# Patient Record
Sex: Female | Born: 1965
Health system: Southern US, Community
[De-identification: ages and names within clinical notes are randomized; demographics above are authoritative.]

## PROBLEM LIST (undated history)

## (undated) DIAGNOSIS — R161 Splenomegaly, not elsewhere classified: Secondary | ICD-10-CM

## (undated) DIAGNOSIS — Z8619 Personal history of other infectious and parasitic diseases: Secondary | ICD-10-CM

## (undated) DIAGNOSIS — Z789 Other specified health status: Secondary | ICD-10-CM

## (undated) DIAGNOSIS — C8280 Other types of follicular lymphoma, unspecified site: Secondary | ICD-10-CM

## (undated) HISTORY — PX: APPENDECTOMY: SHX54

## (undated) HISTORY — DX: Other types of follicular lymphoma, unspecified site: C82.80

## (undated) HISTORY — DX: Personal history of other infectious and parasitic diseases: Z86.19

---

## 1997-09-28 ENCOUNTER — Inpatient Hospital Stay (HOSPITAL_COMMUNITY): Admission: AD | Admit: 1997-09-28 | Discharge: 1997-09-28 | Payer: Self-pay | Admitting: Obstetrics and Gynecology

## 1997-10-06 ENCOUNTER — Inpatient Hospital Stay (HOSPITAL_COMMUNITY): Admission: AD | Admit: 1997-10-06 | Discharge: 1997-10-06 | Payer: Self-pay | Admitting: *Deleted

## 1997-10-07 ENCOUNTER — Inpatient Hospital Stay (HOSPITAL_COMMUNITY): Admission: AD | Admit: 1997-10-07 | Discharge: 1997-10-09 | Payer: Self-pay | Admitting: Obstetrics and Gynecology

## 1999-08-05 ENCOUNTER — Other Ambulatory Visit: Admission: RE | Admit: 1999-08-05 | Discharge: 1999-08-05 | Payer: Self-pay | Admitting: *Deleted

## 2001-03-10 ENCOUNTER — Other Ambulatory Visit: Admission: RE | Admit: 2001-03-10 | Discharge: 2001-03-10 | Payer: Self-pay | Admitting: *Deleted

## 2002-05-16 ENCOUNTER — Other Ambulatory Visit: Admission: RE | Admit: 2002-05-16 | Discharge: 2002-05-16 | Payer: Self-pay | Admitting: Obstetrics and Gynecology

## 2004-03-17 ENCOUNTER — Other Ambulatory Visit: Admission: RE | Admit: 2004-03-17 | Discharge: 2004-03-17 | Payer: Self-pay | Admitting: Obstetrics and Gynecology

## 2005-09-23 ENCOUNTER — Encounter: Admission: RE | Admit: 2005-09-23 | Discharge: 2005-09-23 | Payer: Self-pay | Admitting: Obstetrics and Gynecology

## 2007-02-24 ENCOUNTER — Encounter: Admission: RE | Admit: 2007-02-24 | Discharge: 2007-02-24 | Payer: Self-pay | Admitting: Obstetrics and Gynecology

## 2008-01-26 HISTORY — PX: PORT A CATH REVISION: SHX6033

## 2008-09-23 ENCOUNTER — Ambulatory Visit: Payer: Self-pay | Admitting: Interventional Radiology

## 2008-09-23 ENCOUNTER — Emergency Department (HOSPITAL_BASED_OUTPATIENT_CLINIC_OR_DEPARTMENT_OTHER): Admission: EM | Admit: 2008-09-23 | Discharge: 2008-09-23 | Payer: Self-pay | Admitting: Emergency Medicine

## 2008-09-24 ENCOUNTER — Inpatient Hospital Stay (HOSPITAL_COMMUNITY): Admission: AD | Admit: 2008-09-24 | Discharge: 2008-09-27 | Payer: Self-pay

## 2008-09-24 ENCOUNTER — Ambulatory Visit: Payer: Self-pay | Admitting: Hematology & Oncology

## 2008-09-25 ENCOUNTER — Encounter (INDEPENDENT_AMBULATORY_CARE_PROVIDER_SITE_OTHER): Payer: Self-pay | Admitting: Interventional Radiology

## 2008-09-25 ENCOUNTER — Ambulatory Visit: Payer: Self-pay | Admitting: Hematology & Oncology

## 2008-10-09 ENCOUNTER — Ambulatory Visit (HOSPITAL_COMMUNITY): Admission: RE | Admit: 2008-10-09 | Discharge: 2008-10-09 | Payer: Self-pay | Admitting: Hematology & Oncology

## 2008-10-09 ENCOUNTER — Encounter: Payer: Self-pay | Admitting: Hematology & Oncology

## 2008-10-10 ENCOUNTER — Ambulatory Visit (HOSPITAL_COMMUNITY): Admission: RE | Admit: 2008-10-10 | Discharge: 2008-10-10 | Payer: Self-pay | Admitting: Hematology & Oncology

## 2008-10-11 ENCOUNTER — Ambulatory Visit (HOSPITAL_COMMUNITY): Admission: RE | Admit: 2008-10-11 | Discharge: 2008-10-11 | Payer: Self-pay | Admitting: General Surgery

## 2008-10-11 ENCOUNTER — Encounter (INDEPENDENT_AMBULATORY_CARE_PROVIDER_SITE_OTHER): Payer: Self-pay | Admitting: General Surgery

## 2008-10-18 ENCOUNTER — Ambulatory Visit (HOSPITAL_COMMUNITY): Admission: RE | Admit: 2008-10-18 | Discharge: 2008-10-18 | Payer: Self-pay | Admitting: General Surgery

## 2008-11-01 ENCOUNTER — Ambulatory Visit: Payer: Self-pay | Admitting: Hematology & Oncology

## 2008-11-04 LAB — CBC WITH DIFFERENTIAL (CANCER CENTER ONLY)
BASO#: 0 10*3/uL (ref 0.0–0.2)
Eosinophils Absolute: 0.1 10*3/uL (ref 0.0–0.5)
HGB: 11.2 g/dL — ABNORMAL LOW (ref 11.6–15.9)
MCH: 23.5 pg — ABNORMAL LOW (ref 26.0–34.0)
MONO%: 7.4 % (ref 0.0–13.0)
NEUT#: 3.1 10*3/uL (ref 1.5–6.5)
WBC: 4.4 10*3/uL (ref 3.9–10.0)

## 2008-11-04 LAB — COMPREHENSIVE METABOLIC PANEL
Alkaline Phosphatase: 95 U/L (ref 39–117)
CO2: 25 mEq/L (ref 19–32)
Creatinine, Ser: 0.64 mg/dL (ref 0.40–1.20)
Glucose, Bld: 95 mg/dL (ref 70–99)
Sodium: 138 mEq/L (ref 135–145)
Total Bilirubin: 0.9 mg/dL (ref 0.3–1.2)
Total Protein: 6 g/dL (ref 6.0–8.3)

## 2008-12-02 ENCOUNTER — Ambulatory Visit: Payer: Self-pay | Admitting: Hematology & Oncology

## 2008-12-02 LAB — CBC WITH DIFFERENTIAL (CANCER CENTER ONLY)
BASO%: 1.1 % (ref 0.0–2.0)
HCT: 39.6 % (ref 34.8–46.6)
LYMPH%: 16 % (ref 14.0–48.0)
MCH: 25.1 pg — ABNORMAL LOW (ref 26.0–34.0)
MCV: 72 fL — ABNORMAL LOW (ref 81–101)
MONO#: 0.5 10*3/uL (ref 0.1–0.9)
MONO%: 6 % (ref 0.0–13.0)
NEUT%: 73.4 % (ref 39.6–80.0)
Platelets: 155 10*3/uL (ref 145–400)
RDW: 17.8 % — ABNORMAL HIGH (ref 10.5–14.6)
WBC: 7.7 10*3/uL (ref 3.9–10.0)

## 2008-12-02 LAB — COMPREHENSIVE METABOLIC PANEL
AST: 32 U/L (ref 0–37)
Albumin: 4.4 g/dL (ref 3.5–5.2)
Alkaline Phosphatase: 74 U/L (ref 39–117)
BUN: 12 mg/dL (ref 6–23)
Potassium: 3.8 mEq/L (ref 3.5–5.3)
Sodium: 137 mEq/L (ref 135–145)
Total Protein: 6.8 g/dL (ref 6.0–8.3)

## 2008-12-23 ENCOUNTER — Ambulatory Visit (HOSPITAL_COMMUNITY): Admission: RE | Admit: 2008-12-23 | Discharge: 2008-12-23 | Payer: Self-pay | Admitting: Hematology & Oncology

## 2008-12-30 LAB — CBC WITH DIFFERENTIAL (CANCER CENTER ONLY)
BASO#: 0 10*3/uL (ref 0.0–0.2)
Eosinophils Absolute: 0.2 10*3/uL (ref 0.0–0.5)
HCT: 39.3 % (ref 34.8–46.6)
HGB: 13.7 g/dL (ref 11.6–15.9)
LYMPH%: 8.8 % — ABNORMAL LOW (ref 14.0–48.0)
MCH: 26.8 pg (ref 26.0–34.0)
MCV: 77 fL — ABNORMAL LOW (ref 81–101)
MONO#: 0.3 10*3/uL (ref 0.1–0.9)
MONO%: 5.8 % (ref 0.0–13.0)
NEUT%: 81.2 % — ABNORMAL HIGH (ref 39.6–80.0)
Platelets: 155 10*3/uL (ref 145–400)
RBC: 5.11 10*6/uL (ref 3.70–5.32)
WBC: 4.9 10*3/uL (ref 3.9–10.0)

## 2008-12-30 LAB — COMPREHENSIVE METABOLIC PANEL
Albumin: 4.4 g/dL (ref 3.5–5.2)
BUN: 14 mg/dL (ref 6–23)
CO2: 23 mEq/L (ref 19–32)
Calcium: 8.8 mg/dL (ref 8.4–10.5)
Chloride: 105 mEq/L (ref 96–112)
Glucose, Bld: 95 mg/dL (ref 70–99)
Potassium: 3.9 mEq/L (ref 3.5–5.3)

## 2008-12-30 LAB — LACTATE DEHYDROGENASE: LDH: 127 U/L (ref 94–250)

## 2009-01-23 ENCOUNTER — Ambulatory Visit: Payer: Self-pay | Admitting: Hematology & Oncology

## 2009-01-27 LAB — CBC WITH DIFFERENTIAL (CANCER CENTER ONLY)
BASO#: 0 10*3/uL (ref 0.0–0.2)
HCT: 39.1 % (ref 34.8–46.6)
HGB: 13.5 g/dL (ref 11.6–15.9)
LYMPH#: 0.4 10*3/uL — ABNORMAL LOW (ref 0.9–3.3)
MCHC: 34.4 g/dL (ref 32.0–36.0)
MCV: 83 fL (ref 81–101)
MONO#: 0.3 10*3/uL (ref 0.1–0.9)
NEUT%: 82.6 % — ABNORMAL HIGH (ref 39.6–80.0)
WBC: 4.6 10*3/uL (ref 3.9–10.0)

## 2009-01-27 LAB — COMPREHENSIVE METABOLIC PANEL
BUN: 14 mg/dL (ref 6–23)
CO2: 20 mEq/L (ref 19–32)
Calcium: 8.6 mg/dL (ref 8.4–10.5)
Chloride: 105 mEq/L (ref 96–112)
Creatinine, Ser: 0.57 mg/dL (ref 0.40–1.20)

## 2009-01-27 LAB — LACTATE DEHYDROGENASE: LDH: 142 U/L (ref 94–250)

## 2009-02-24 ENCOUNTER — Ambulatory Visit: Payer: Self-pay | Admitting: Hematology & Oncology

## 2009-02-24 LAB — CBC WITH DIFFERENTIAL (CANCER CENTER ONLY)
BASO#: 0 10*3/uL (ref 0.0–0.2)
EOS%: 3.8 % (ref 0.0–7.0)
Eosinophils Absolute: 0.1 10*3/uL (ref 0.0–0.5)
HGB: 13 g/dL (ref 11.6–15.9)
LYMPH#: 0.3 10*3/uL — ABNORMAL LOW (ref 0.9–3.3)
NEUT#: 2.8 10*3/uL (ref 1.5–6.5)
RBC: 4.2 10*6/uL (ref 3.70–5.32)
WBC: 3.4 10*3/uL — ABNORMAL LOW (ref 3.9–10.0)

## 2009-02-24 LAB — COMPREHENSIVE METABOLIC PANEL
AST: 30 U/L (ref 0–37)
Albumin: 4 g/dL (ref 3.5–5.2)
BUN: 12 mg/dL (ref 6–23)
Calcium: 8.8 mg/dL (ref 8.4–10.5)
Chloride: 108 mEq/L (ref 96–112)
Glucose, Bld: 85 mg/dL (ref 70–99)
Potassium: 4 mEq/L (ref 3.5–5.3)

## 2009-03-24 LAB — CBC WITH DIFFERENTIAL (CANCER CENTER ONLY)
BASO#: 0 10*3/uL (ref 0.0–0.2)
EOS%: 3.6 % (ref 0.0–7.0)
Eosinophils Absolute: 0.1 10*3/uL (ref 0.0–0.5)
HCT: 39.4 % (ref 34.8–46.6)
HGB: 13.5 g/dL (ref 11.6–15.9)
LYMPH%: 10 % — ABNORMAL LOW (ref 14.0–48.0)
MCH: 30.4 pg (ref 26.0–34.0)
MCHC: 34.4 g/dL (ref 32.0–36.0)
MCV: 89 fL (ref 81–101)
MONO%: 6.6 % (ref 0.0–13.0)
NEUT#: 2.8 10*3/uL (ref 1.5–6.5)
NEUT%: 79.3 % (ref 39.6–80.0)
RBC: 4.45 10*6/uL (ref 3.70–5.32)

## 2009-03-24 LAB — COMPREHENSIVE METABOLIC PANEL
AST: 28 U/L (ref 0–37)
Albumin: 4.5 g/dL (ref 3.5–5.2)
Alkaline Phosphatase: 57 U/L (ref 39–117)
BUN: 11 mg/dL (ref 6–23)
Creatinine, Ser: 0.62 mg/dL (ref 0.40–1.20)
Glucose, Bld: 91 mg/dL (ref 70–99)
Potassium: 3.9 mEq/L (ref 3.5–5.3)

## 2009-04-28 ENCOUNTER — Ambulatory Visit (HOSPITAL_COMMUNITY): Admission: RE | Admit: 2009-04-28 | Discharge: 2009-04-28 | Payer: Self-pay | Admitting: Hematology & Oncology

## 2009-05-01 ENCOUNTER — Ambulatory Visit: Payer: Self-pay | Admitting: Hematology & Oncology

## 2009-05-05 LAB — CBC WITH DIFFERENTIAL (CANCER CENTER ONLY)
BASO%: 0.2 % (ref 0.0–2.0)
EOS%: 3.4 % (ref 0.0–7.0)
LYMPH#: 0.2 10*3/uL — ABNORMAL LOW (ref 0.9–3.3)
LYMPH%: 5.9 % — ABNORMAL LOW (ref 14.0–48.0)
MCHC: 34.6 g/dL (ref 32.0–36.0)
MCV: 89 fL (ref 81–101)
MONO#: 0.2 10*3/uL (ref 0.1–0.9)
Platelets: 119 10*3/uL — ABNORMAL LOW (ref 145–400)
RDW: 13.1 % (ref 10.5–14.6)
WBC: 3.8 10*3/uL — ABNORMAL LOW (ref 3.9–10.0)

## 2009-05-05 LAB — COMPREHENSIVE METABOLIC PANEL
ALT: 28 U/L (ref 0–35)
AST: 24 U/L (ref 0–37)
CO2: 27 mEq/L (ref 19–32)
Creatinine, Ser: 0.81 mg/dL (ref 0.40–1.20)
Sodium: 140 mEq/L (ref 135–145)
Total Bilirubin: 1.7 mg/dL — ABNORMAL HIGH (ref 0.3–1.2)
Total Protein: 6.4 g/dL (ref 6.0–8.3)

## 2009-05-05 LAB — LACTATE DEHYDROGENASE: LDH: 143 U/L (ref 94–250)

## 2009-06-02 ENCOUNTER — Ambulatory Visit: Payer: Self-pay | Admitting: Hematology & Oncology

## 2009-08-01 ENCOUNTER — Ambulatory Visit: Payer: Self-pay | Admitting: Hematology & Oncology

## 2009-08-04 LAB — LACTATE DEHYDROGENASE: LDH: 123 U/L (ref 94–250)

## 2009-08-04 LAB — COMPREHENSIVE METABOLIC PANEL
ALT: 21 U/L (ref 0–35)
Albumin: 4.2 g/dL (ref 3.5–5.2)
CO2: 22 mEq/L (ref 19–32)
Glucose, Bld: 168 mg/dL — ABNORMAL HIGH (ref 70–99)
Potassium: 3.6 mEq/L (ref 3.5–5.3)
Sodium: 138 mEq/L (ref 135–145)
Total Protein: 6 g/dL (ref 6.0–8.3)

## 2009-08-04 LAB — CBC WITH DIFFERENTIAL (CANCER CENTER ONLY)
BASO%: 0.4 % (ref 0.0–2.0)
Eosinophils Absolute: 0.1 10*3/uL (ref 0.0–0.5)
LYMPH#: 0.4 10*3/uL — ABNORMAL LOW (ref 0.9–3.3)
MONO#: 0.3 10*3/uL (ref 0.1–0.9)
NEUT#: 3.4 10*3/uL (ref 1.5–6.5)
Platelets: 165 10*3/uL (ref 145–400)
RBC: 4.55 10*6/uL (ref 3.70–5.32)
WBC: 4.2 10*3/uL (ref 3.9–10.0)

## 2009-10-03 ENCOUNTER — Ambulatory Visit: Payer: Self-pay | Admitting: Hematology & Oncology

## 2009-10-06 LAB — CBC WITH DIFFERENTIAL (CANCER CENTER ONLY)
BASO%: 0.6 % (ref 0.0–2.0)
EOS%: 3.5 % (ref 0.0–7.0)
Eosinophils Absolute: 0.2 10*3/uL (ref 0.0–0.5)
MCH: 30 pg (ref 26.0–34.0)
MONO%: 5.9 % (ref 0.0–13.0)
NEUT#: 3.5 10*3/uL (ref 1.5–6.5)
Platelets: 155 10*3/uL (ref 145–400)
RBC: 4.75 10*6/uL (ref 3.70–5.32)
RDW: 11.8 % (ref 10.5–14.6)

## 2009-12-01 ENCOUNTER — Ambulatory Visit (HOSPITAL_COMMUNITY): Admission: RE | Admit: 2009-12-01 | Discharge: 2009-12-01 | Payer: Self-pay | Admitting: Hematology & Oncology

## 2009-12-05 ENCOUNTER — Ambulatory Visit: Payer: Self-pay | Admitting: Hematology & Oncology

## 2009-12-08 LAB — CBC WITH DIFFERENTIAL (CANCER CENTER ONLY)
BASO%: 0.3 % (ref 0.0–2.0)
EOS%: 2.5 % (ref 0.0–7.0)
HCT: 41.9 % (ref 34.8–46.6)
HGB: 14.1 g/dL (ref 11.6–15.9)
LYMPH%: 10.6 % — ABNORMAL LOW (ref 14.0–48.0)
MCH: 29.5 pg (ref 26.0–34.0)
MCHC: 33.8 g/dL (ref 32.0–36.0)
MONO#: 0.3 10*3/uL (ref 0.1–0.9)
Platelets: 161 10*3/uL (ref 145–400)
RBC: 4.79 10*6/uL (ref 3.70–5.32)
RDW: 12 % (ref 10.5–14.6)

## 2009-12-08 LAB — COMPREHENSIVE METABOLIC PANEL
ALT: 15 U/L (ref 0–35)
AST: 20 U/L (ref 0–37)
Alkaline Phosphatase: 60 U/L (ref 39–117)
CO2: 27 mEq/L (ref 19–32)
Sodium: 140 mEq/L (ref 135–145)
Total Bilirubin: 1.1 mg/dL (ref 0.3–1.2)
Total Protein: 6.6 g/dL (ref 6.0–8.3)

## 2009-12-08 LAB — LACTATE DEHYDROGENASE: LDH: 134 U/L (ref 94–250)

## 2010-02-06 ENCOUNTER — Ambulatory Visit: Payer: Self-pay | Admitting: Hematology & Oncology

## 2010-02-09 LAB — CBC WITH DIFFERENTIAL (CANCER CENTER ONLY)
BASO#: 0 10*3/uL (ref 0.0–0.2)
BASO%: 0.6 % (ref 0.0–2.0)
EOS%: 2.5 % (ref 0.0–7.0)
Eosinophils Absolute: 0.1 10*3/uL (ref 0.0–0.5)
HCT: 42.1 % (ref 34.8–46.6)
HGB: 14.4 g/dL (ref 11.6–15.9)
LYMPH#: 0.5 10*3/uL — ABNORMAL LOW (ref 0.9–3.3)
LYMPH%: 11 % — ABNORMAL LOW (ref 14.0–48.0)
MCH: 29.5 pg (ref 26.0–34.0)
MCHC: 34.1 g/dL (ref 32.0–36.0)
MCV: 87 fL (ref 81–101)
MONO#: 0.3 10*3/uL (ref 0.1–0.9)
MONO%: 6.4 % (ref 0.0–13.0)
NEUT#: 3.9 10*3/uL (ref 1.5–6.5)
NEUT%: 79.5 % (ref 39.6–80.0)
Platelets: 171 10*3/uL (ref 145–400)
RBC: 4.86 10*6/uL (ref 3.70–5.32)
RDW: 12.9 % (ref 10.5–14.6)
WBC: 4.9 10*3/uL (ref 3.9–10.0)

## 2010-02-09 LAB — COMPREHENSIVE METABOLIC PANEL
ALT: 22 U/L (ref 0–35)
AST: 17 U/L (ref 0–37)
Albumin: 4.5 g/dL (ref 3.5–5.2)
Alkaline Phosphatase: 62 U/L (ref 39–117)
BUN: 13 mg/dL (ref 6–23)
CO2: 27 mEq/L (ref 19–32)
Calcium: 9.3 mg/dL (ref 8.4–10.5)
Chloride: 103 mEq/L (ref 96–112)
Creatinine, Ser: 0.63 mg/dL (ref 0.40–1.20)
Glucose, Bld: 87 mg/dL (ref 70–99)
Potassium: 4.1 mEq/L (ref 3.5–5.3)
Sodium: 138 mEq/L (ref 135–145)
Total Bilirubin: 1 mg/dL (ref 0.3–1.2)
Total Protein: 6.3 g/dL (ref 6.0–8.3)

## 2010-02-09 LAB — VITAMIN D 25 HYDROXY (VIT D DEFICIENCY, FRACTURES): Vit D, 25-Hydroxy: 31 ng/mL (ref 30–89)

## 2010-04-06 ENCOUNTER — Encounter (HOSPITAL_BASED_OUTPATIENT_CLINIC_OR_DEPARTMENT_OTHER): Payer: Managed Care, Other (non HMO) | Admitting: Hematology & Oncology

## 2010-04-06 ENCOUNTER — Other Ambulatory Visit: Payer: Self-pay | Admitting: Family

## 2010-04-06 DIAGNOSIS — C8299 Follicular lymphoma, unspecified, extranodal and solid organ sites: Secondary | ICD-10-CM

## 2010-04-06 DIAGNOSIS — Z5112 Encounter for antineoplastic immunotherapy: Secondary | ICD-10-CM

## 2010-04-06 DIAGNOSIS — C8599 Non-Hodgkin lymphoma, unspecified, extranodal and solid organ sites: Secondary | ICD-10-CM

## 2010-04-06 LAB — COMPREHENSIVE METABOLIC PANEL
Albumin: 4.6 g/dL (ref 3.5–5.2)
BUN: 16 mg/dL (ref 6–23)
CO2: 22 mEq/L (ref 19–32)
Calcium: 9.3 mg/dL (ref 8.4–10.5)
Chloride: 103 mEq/L (ref 96–112)
Creatinine, Ser: 0.74 mg/dL (ref 0.40–1.20)
Glucose, Bld: 87 mg/dL (ref 70–99)

## 2010-04-06 LAB — CBC WITH DIFFERENTIAL (CANCER CENTER ONLY)
BASO%: 0.4 % (ref 0.0–2.0)
LYMPH#: 0.5 10*3/uL — ABNORMAL LOW (ref 0.9–3.3)
MONO#: 0.4 10*3/uL (ref 0.1–0.9)
Platelets: 138 10*3/uL — ABNORMAL LOW (ref 145–400)
RDW: 13.7 % (ref 11.1–15.7)
WBC: 5.2 10*3/uL (ref 3.9–10.0)

## 2010-04-06 LAB — LACTATE DEHYDROGENASE: LDH: 149 U/L (ref 94–250)

## 2010-04-06 LAB — VITAMIN D 25 HYDROXY (VIT D DEFICIENCY, FRACTURES): Vit D, 25-Hydroxy: 28 ng/mL — ABNORMAL LOW (ref 30–89)

## 2010-04-07 LAB — GLUCOSE, CAPILLARY: Glucose-Capillary: 89 mg/dL (ref 70–99)

## 2010-04-15 LAB — GLUCOSE, CAPILLARY: Glucose-Capillary: 93 mg/dL (ref 70–99)

## 2010-04-29 LAB — GLUCOSE, CAPILLARY: Glucose-Capillary: 88 mg/dL (ref 70–99)

## 2010-05-01 LAB — CBC
HCT: 35.5 % — ABNORMAL LOW (ref 36.0–46.0)
Hemoglobin: 11.7 g/dL — ABNORMAL LOW (ref 12.0–15.0)
MCHC: 32.8 g/dL (ref 30.0–36.0)
Platelets: 212 10*3/uL (ref 150–400)
Platelets: 173 10*3/uL (ref 150–400)
RBC: 5.12 MIL/uL — ABNORMAL HIGH (ref 3.87–5.11)
RBC: 4.85 MIL/uL (ref 3.87–5.11)
RDW: 15.9 % — ABNORMAL HIGH (ref 11.5–15.5)
WBC: 4.6 10*3/uL (ref 4.0–10.5)
WBC: 4.4 10*3/uL (ref 4.0–10.5)
WBC: 5 10*3/uL (ref 4.0–10.5)

## 2010-05-01 LAB — DIFFERENTIAL
Basophils Absolute: 0 10*3/uL (ref 0.0–0.1)
Basophils Relative: 0 % (ref 0–1)
Eosinophils Absolute: 0 10*3/uL (ref 0.0–0.7)
Eosinophils Relative: 1 % (ref 0–5)
Lymphs Abs: 0.7 10*3/uL (ref 0.7–4.0)
Monocytes Absolute: 0.4 10*3/uL (ref 0.1–1.0)
Monocytes Relative: 12 % (ref 3–12)
Neutro Abs: 3.5 10*3/uL (ref 1.7–7.7)
Neutro Abs: 3.7 10*3/uL (ref 1.7–7.7)
Neutrophils Relative %: 73 % (ref 43–77)

## 2010-05-01 LAB — COMPREHENSIVE METABOLIC PANEL
Albumin: 3.4 g/dL — ABNORMAL LOW (ref 3.5–5.2)
Alkaline Phosphatase: 72 U/L (ref 39–117)
BUN: 6 mg/dL (ref 6–23)
Chloride: 106 mEq/L (ref 96–112)
Potassium: 4.5 mEq/L (ref 3.5–5.1)
Total Bilirubin: 0.9 mg/dL (ref 0.3–1.2)

## 2010-05-01 LAB — URINALYSIS, ROUTINE W REFLEX MICROSCOPIC
Bilirubin Urine: NEGATIVE
Ketones, ur: NEGATIVE mg/dL
Nitrite: NEGATIVE
Protein, ur: NEGATIVE mg/dL
Specific Gravity, Urine: 1.013 (ref 1.005–1.030)
Urobilinogen, UA: 0.2 mg/dL (ref 0.0–1.0)

## 2010-05-01 LAB — PREGNANCY, URINE: Preg Test, Ur: NEGATIVE

## 2010-05-01 LAB — CHROMOSOME ANALYSIS, BONE MARROW

## 2010-05-01 LAB — GLUCOSE, CAPILLARY: Glucose-Capillary: 95 mg/dL (ref 70–99)

## 2010-05-01 LAB — BONE MARROW EXAM: Bone Marrow Exam: 323

## 2010-05-02 LAB — COMPREHENSIVE METABOLIC PANEL
ALT: 12 U/L (ref 0–35)
ALT: 9 U/L (ref 0–35)
AST: 20 U/L (ref 0–37)
Albumin: 4 g/dL (ref 3.5–5.2)
Alkaline Phosphatase: 83 U/L (ref 39–117)
BUN: 10 mg/dL (ref 6–23)
CO2: 27 mEq/L (ref 19–32)
Calcium: 9.3 mg/dL (ref 8.4–10.5)
Chloride: 104 mEq/L (ref 96–112)
Creatinine, Ser: 0.7 mg/dL (ref 0.4–1.2)
GFR calc Af Amer: >60 mL/min (ref >60)
GFR calc non Af Amer: >60 mL/min (ref >60)
Glucose, Bld: 104 mg/dL — ABNORMAL HIGH (ref 70–99)
Potassium: 3.8 mEq/L (ref 3.5–5.1)
Sodium: 136 mEq/L (ref 135–145)
Sodium: 139 mEq/L (ref 135–145)
Total Bilirubin: 1.1 mg/dL (ref 0.3–1.2)
Total Protein: 7.3 g/dL (ref 6.0–8.3)
Total Protein: 7.6 g/dL (ref 6.0–8.3)

## 2010-05-02 LAB — CBC
HCT: 34.5 % — ABNORMAL LOW (ref 36.0–46.0)
Hemoglobin: 11.4 g/dL — ABNORMAL LOW (ref 12.0–15.0)
MCHC: 32.9 g/dL (ref 30.0–36.0)
Platelets: 220 10*3/uL (ref 150–400)
RBC: 4.87 MIL/uL (ref 3.87–5.11)
RBC: 5.22 MIL/uL — ABNORMAL HIGH (ref 3.87–5.11)
RDW: 15.8 % — ABNORMAL HIGH (ref 11.5–15.5)
WBC: 6.1 10*3/uL (ref 4.0–10.5)

## 2010-05-02 LAB — DIFFERENTIAL
Eosinophils Absolute: 0 10*3/uL (ref 0.0–0.7)
Eosinophils Relative: 0 % (ref 0–5)
Lymphocytes Relative: 6 % — ABNORMAL LOW (ref 12–46)
Lymphs Abs: 0.5 10*3/uL — ABNORMAL LOW (ref 0.7–4.0)
Monocytes Absolute: 0.4 10*3/uL (ref 0.1–1.0)
Monocytes Relative: 5 % (ref 3–12)

## 2010-05-02 LAB — URINALYSIS, ROUTINE W REFLEX MICROSCOPIC
Bilirubin Urine: NEGATIVE
Nitrite: NEGATIVE
Protein, ur: NEGATIVE mg/dL
Specific Gravity, Urine: 1.02 (ref 1.005–1.030)
Urobilinogen, UA: 1 mg/dL (ref 0.0–1.0)

## 2010-05-02 LAB — PROTIME-INR: INR: 1.1 (ref 0.00–1.49)

## 2010-05-02 LAB — PREGNANCY, URINE: Preg Test, Ur: NEGATIVE

## 2010-05-02 LAB — APTT: aPTT: 32 seconds (ref 24–37)

## 2010-06-08 ENCOUNTER — Other Ambulatory Visit: Payer: Self-pay | Admitting: Hematology & Oncology

## 2010-06-08 ENCOUNTER — Encounter (HOSPITAL_BASED_OUTPATIENT_CLINIC_OR_DEPARTMENT_OTHER): Payer: Managed Care, Other (non HMO) | Admitting: Hematology & Oncology

## 2010-06-08 DIAGNOSIS — C8299 Follicular lymphoma, unspecified, extranodal and solid organ sites: Secondary | ICD-10-CM

## 2010-06-08 DIAGNOSIS — Z5112 Encounter for antineoplastic immunotherapy: Secondary | ICD-10-CM

## 2010-06-08 LAB — CBC WITH DIFFERENTIAL (CANCER CENTER ONLY)
BASO#: 0 10*3/uL (ref 0.0–0.2)
HCT: 39.7 % (ref 34.8–46.6)
HGB: 14.5 g/dL (ref 11.6–15.9)
LYMPH#: 0.6 10*3/uL — ABNORMAL LOW (ref 0.9–3.3)
MONO#: 0.4 10*3/uL (ref 0.1–0.9)
NEUT%: 74.8 % (ref 39.6–80.0)
WBC: 4.6 10*3/uL (ref 3.9–10.0)

## 2010-06-09 LAB — LACTATE DEHYDROGENASE: LDH: 152 U/L (ref 94–250)

## 2010-06-09 LAB — VITAMIN D 25 HYDROXY (VIT D DEFICIENCY, FRACTURES): Vit D, 25-Hydroxy: 30 ng/mL (ref 30–89)

## 2010-06-09 NOTE — H&P (Signed)
Stacey Livingston, Stacey Livingston              ACCOUNT NO.:  [id number]   MEDICAL RECORD NO.:  [id number]          PATIENT TYPE:  EMS   LOCATION:  ED                            FACILITY:  [facility]   PHYSICIAN:  Thomas A. Cornett, M.D.DATE OF BIRTH:  01-28-1965   DATE OF ADMISSION:  09/23/2008  DATE OF DISCHARGE:  09/23/2008                              HISTORY & PHYSICAL   CHIEF COMPLAINT:  Abdominal pain.   HISTORY OF PRESENT ILLNESS:  The patient is a very pleasant 45 year old  female with a 2-day history of abdominal pain.  Stacey Livingston was seen last night  at the [facility] Emergency Room where a CT scan revealed significant  retroperitoneal mesenteric and iliac artery adenopathy and splenomegaly  worrisome for lymphoma.  Stacey Livingston had a 2-day history of midepigastric and  right upper quadrant-type abdominal pain.  It is moderate in nature.  It  was not severe, though.  Stacey Livingston was sent home last night, and then the  radiologist re-reviewed the CT and saw signs of acute appendicitis.  Apparently, Dr. Bertram Savin was called on call and recommended followup  today in the office, and Stacey Livingston was not having a lot of abdominal pain.  Today, Stacey Livingston still has some midepigastric tenderness and right upper  quadrant tenderness.  It is a little bit worse than yesterday, Stacey Livingston  thinks.  No nausea, no vomiting.  Bowel function has been normal.   PAST MEDICAL HISTORY:  None.   PAST SURGICAL HISTORY:  None.   ALLERGIES:  PENICILLIN.   MEDICATIONS:  None.   FAMILY HISTORY:  Noncontributory.   SOCIAL HISTORY:  Denies tobacco or alcohol use.  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   REVIEW OF SYSTEMS:  As above, negative x15, otherwise.   PHYSICAL EXAMINATION:  VITAL SIGNS:  Temperature 99, pulse 90, blood  pressure 124/72.  HEENT:  Extraocular movements are intact.  Oropharynx is clear.  NECK:  Supple, nontender.  No JVD.  CHEST:  Clear to auscultation.  Chest wall motion is normal.  CARDIOVASCULAR:  Regular  rate and rhythm without rub, murmur or gallop.  EXTREMITIES:  Warm, well perfused.  ABDOMEN:  There is mild midepigastric tenderness and mild right upper  quadrant tenderness without peritoneal signs.  No rebound or guarding.  The liver and spleen are enlarged.  No peritonitis.  EXTREMITIES:  No edema.  No clubbing, no cyanosis.  NEUROLOGICAL EXAMINATION:  Glasgow coma scale is 15.  Motor and sensory  functions are grossly intact.   Abdominal and pelvic CT scan was reviewed by myself.  Stacey Livingston has  significant retroperitoneal mesenteric and iliac adenopathy noted.  Stacey Livingston  also has some stranding around the appendix, and the appendix actually  is in the right mid to right upper quadrant adjacent to the right lobe  of the liver.  There is no abscess.   White count of 8100, hemoglobin 12.3, platelet count at 220,000.  Urinalysis is normal.  The urine pregnancy test is normal.  Sodium 139,  potassium 4.2, chloride 107, CO2 of 27, BUN 10, creatinine 0.7, glucose  92; lipase and  amylase are within normal limits.   IMPRESSION:  1. Probable lymphoma.  2. Acute appendicitis.   PLAN:  Will be admitted to [facility].  I have discussed the  case with Dr. Claud Kelp who is on call and will assume her care at  this point.  Stacey Livingston certainly, I think, can be managed with antibiotics  since her pain is not progressing, but Stacey Livingston will need to have her CBC  rechecked upon admission, and, actually, Dr. Myna Hidalgo, her oncologist,  needs to be contacted as well.  If Stacey Livingston worsens or Dr. Derrell Lolling feels  appendectomy is warranted, certainly this will need to be done next.  Currently, though, Stacey Livingston is minimally symptomatic and has minimal physical  exam findings to support appendicitis.  Therefore, I think medical  management would be appropriate in her care, but I will defer to Dr.  Derrell Lolling after he sees her to make sure Stacey Livingston does not worsen.      Thomas A. Cornett, M.D.  Electronically Signed      TAC/MEDQ  D:  09/24/2008  T:  09/24/2008  Job:  [id number]   cc:   Rose Phi. Myna Hidalgo, M.D.  Fax: [phone]

## 2010-08-10 ENCOUNTER — Encounter (HOSPITAL_BASED_OUTPATIENT_CLINIC_OR_DEPARTMENT_OTHER): Payer: Managed Care, Other (non HMO) | Admitting: Hematology & Oncology

## 2010-08-10 ENCOUNTER — Other Ambulatory Visit: Payer: Self-pay | Admitting: Hematology & Oncology

## 2010-08-10 DIAGNOSIS — Z5112 Encounter for antineoplastic immunotherapy: Secondary | ICD-10-CM

## 2010-08-10 DIAGNOSIS — Z5111 Encounter for antineoplastic chemotherapy: Secondary | ICD-10-CM

## 2010-08-10 DIAGNOSIS — C8299 Follicular lymphoma, unspecified, extranodal and solid organ sites: Secondary | ICD-10-CM

## 2010-08-10 LAB — CBC WITH DIFFERENTIAL (CANCER CENTER ONLY)
Eosinophils Absolute: 0.1 10*3/uL (ref 0.0–0.5)
LYMPH#: 0.4 10*3/uL — ABNORMAL LOW (ref 0.9–3.3)
MONO#: 0.4 10*3/uL (ref 0.1–0.9)
MONO%: 9.9 % (ref 0.0–13.0)
NEUT#: 2.7 10*3/uL (ref 1.5–6.5)
Platelets: 132 10*3/uL — ABNORMAL LOW (ref 145–400)
RBC: 4.63 10*6/uL (ref 3.70–5.32)
WBC: 3.6 10*3/uL — ABNORMAL LOW (ref 3.9–10.0)

## 2010-08-10 LAB — COMPREHENSIVE METABOLIC PANEL
Albumin: 4.3 g/dL (ref 3.5–5.2)
Alkaline Phosphatase: 58 U/L (ref 39–117)
Calcium: 9.2 mg/dL (ref 8.4–10.5)
Chloride: 105 mEq/L (ref 96–112)
Glucose, Bld: 94 mg/dL (ref 70–99)
Potassium: 3.7 mEq/L (ref 3.5–5.3)
Sodium: 139 mEq/L (ref 135–145)
Total Protein: 6.3 g/dL (ref 6.0–8.3)

## 2010-10-12 ENCOUNTER — Other Ambulatory Visit: Payer: Self-pay | Admitting: Hematology & Oncology

## 2010-10-12 ENCOUNTER — Ambulatory Visit (HOSPITAL_BASED_OUTPATIENT_CLINIC_OR_DEPARTMENT_OTHER): Payer: Managed Care, Other (non HMO) | Admitting: Hematology & Oncology

## 2010-10-12 DIAGNOSIS — Z5112 Encounter for antineoplastic immunotherapy: Secondary | ICD-10-CM

## 2010-10-12 DIAGNOSIS — E559 Vitamin D deficiency, unspecified: Secondary | ICD-10-CM

## 2010-10-12 DIAGNOSIS — C8299 Follicular lymphoma, unspecified, extranodal and solid organ sites: Secondary | ICD-10-CM

## 2010-10-12 LAB — CBC WITH DIFFERENTIAL (CANCER CENTER ONLY)
BASO#: 0 10*3/uL (ref 0.0–0.2)
Eosinophils Absolute: 0.1 10*3/uL (ref 0.0–0.5)
HCT: 39.4 % (ref 34.8–46.6)
HGB: 14.6 g/dL (ref 11.6–15.9)
LYMPH%: 11.7 % — ABNORMAL LOW (ref 14.0–48.0)
MCH: 30.5 pg (ref 26.0–34.0)
MCV: 82 fL (ref 81–101)
MONO%: 6.7 % (ref 0.0–13.0)
NEUT%: 79.3 % (ref 39.6–80.0)
Platelets: 164 10*3/uL (ref 145–400)
RBC: 4.78 10*6/uL (ref 3.70–5.32)

## 2010-10-13 LAB — COMPREHENSIVE METABOLIC PANEL
CO2: 27 mEq/L (ref 19–32)
Calcium: 9.4 mg/dL (ref 8.4–10.5)
Creatinine, Ser: 0.75 mg/dL (ref 0.50–1.10)
Glucose, Bld: 105 mg/dL — ABNORMAL HIGH (ref 70–99)
Total Bilirubin: 0.9 mg/dL (ref 0.3–1.2)

## 2010-10-13 LAB — VITAMIN D 25 HYDROXY (VIT D DEFICIENCY, FRACTURES): Vit D, 25-Hydroxy: 38 ng/mL (ref 30–89)

## 2010-10-13 LAB — LACTATE DEHYDROGENASE: LDH: 151 U/L (ref 94–250)

## 2010-12-14 ENCOUNTER — Encounter: Payer: Self-pay | Admitting: Hematology & Oncology

## 2010-12-14 ENCOUNTER — Other Ambulatory Visit (HOSPITAL_BASED_OUTPATIENT_CLINIC_OR_DEPARTMENT_OTHER): Payer: Managed Care, Other (non HMO) | Admitting: Lab

## 2010-12-14 ENCOUNTER — Ambulatory Visit (HOSPITAL_BASED_OUTPATIENT_CLINIC_OR_DEPARTMENT_OTHER): Payer: Managed Care, Other (non HMO)

## 2010-12-14 ENCOUNTER — Other Ambulatory Visit: Payer: Self-pay | Admitting: Family

## 2010-12-14 ENCOUNTER — Ambulatory Visit (HOSPITAL_BASED_OUTPATIENT_CLINIC_OR_DEPARTMENT_OTHER): Payer: Managed Care, Other (non HMO) | Admitting: Hematology & Oncology

## 2010-12-14 VITALS — BP 88/61 | Temp 98.4°F

## 2010-12-14 VITALS — BP 114/71 | HR 72 | Temp 97.2°F | Ht 67.0 in | Wt 232.0 lb

## 2010-12-14 DIAGNOSIS — Z5112 Encounter for antineoplastic immunotherapy: Secondary | ICD-10-CM

## 2010-12-14 DIAGNOSIS — C8280 Other types of follicular lymphoma, unspecified site: Secondary | ICD-10-CM

## 2010-12-14 DIAGNOSIS — C8299 Follicular lymphoma, unspecified, extranodal and solid organ sites: Secondary | ICD-10-CM

## 2010-12-14 HISTORY — DX: Other types of follicular lymphoma, unspecified site: C82.80

## 2010-12-14 LAB — COMPREHENSIVE METABOLIC PANEL
ALT: 14 U/L (ref 0–35)
AST: 14 U/L (ref 0–37)
CO2: 26 mEq/L (ref 19–32)
Calcium: 9.4 mg/dL (ref 8.4–10.5)
Chloride: 105 mEq/L (ref 96–112)
Creatinine, Ser: 0.69 mg/dL (ref 0.50–1.10)
Potassium: 4.3 mEq/L (ref 3.5–5.3)
Sodium: 139 mEq/L (ref 135–145)
Total Protein: 6.2 g/dL (ref 6.0–8.3)

## 2010-12-14 LAB — CBC WITH DIFFERENTIAL (CANCER CENTER ONLY)
Eosinophils Absolute: 0.1 10*3/uL (ref 0.0–0.5)
LYMPH%: 12.8 % — ABNORMAL LOW (ref 14.0–48.0)
MCH: 30 pg (ref 26.0–34.0)
MCHC: 36 g/dL (ref 32.0–36.0)
MCV: 83 fL (ref 81–101)
MONO%: 8 % (ref 0.0–13.0)
Platelets: 157 10*3/uL (ref 145–400)
RBC: 4.76 10*6/uL (ref 3.70–5.32)

## 2010-12-14 MED ORDER — SODIUM CHLORIDE 0.9 % IV SOLN
800.0000 mg | Freq: Once | INTRAVENOUS | Status: AC
  Administered 2010-12-14: 800 mg via INTRAVENOUS
  Filled 2010-12-14: qty 80

## 2010-12-14 MED ORDER — DIPHENHYDRAMINE HCL 25 MG PO CAPS
50.0000 mg | ORAL_CAPSULE | Freq: Once | ORAL | Status: AC
  Administered 2010-12-14: 25 mg via ORAL

## 2010-12-14 MED ORDER — SODIUM CHLORIDE 0.9 % IJ SOLN
10.0000 mL | INTRAMUSCULAR | Status: DC | PRN
  Administered 2010-12-14: 10 mL
  Filled 2010-12-14: qty 10

## 2010-12-14 MED ORDER — HEPARIN SOD (PORK) LOCK FLUSH 100 UNIT/ML IV SOLN
500.0000 [IU] | Freq: Once | INTRAVENOUS | Status: AC | PRN
  Administered 2010-12-14: 500 [IU]
  Filled 2010-12-14: qty 5

## 2010-12-14 MED ORDER — SODIUM CHLORIDE 0.9 % IV SOLN
Freq: Once | INTRAVENOUS | Status: AC
  Administered 2010-12-14: 11:00:00 via INTRAVENOUS

## 2010-12-14 MED ORDER — ACETAMINOPHEN 325 MG PO TABS
650.0000 mg | ORAL_TABLET | Freq: Once | ORAL | Status: AC
  Administered 2010-12-14: 650 mg via ORAL

## 2010-12-14 MED ORDER — SODIUM CHLORIDE 0.9 % IV SOLN: 375.0000 mg/m2 | Freq: Once | INTRAVENOUS | Status: DC

## 2010-12-14 NOTE — Progress Notes (Signed)
This office note has been dictated.

## 2010-12-15 NOTE — Progress Notes (Signed)
CC:   Stacey Livingston. Stacey Livingston, M.D.  DIAGNOSIS:  Follicular large-cell non-Hodgkin lymphoma.  CURRENT THERAPY:  Patient on maintenance Rituxan q. 2 history.  INTERVAL HISTORY:  Stacey Livingston comes in for followup.  She is doing okay herself.  Her daughter is still having a few issues with respect to anxiety and eating.  Stacey Livingston is doing a great job trying to keep the family together and still works.  She is doing well with Rituxan.  She has had no complaints with the Rituxan.  She has had no fevers, sweats or chills.  There has been no change in bowel or bladder habits.  She has had no problems with nausea or vomiting.  PHYSICAL EXAMINATION:  This is a well-developed well-nourished white female in no obvious distress.  Vital signs:  97.2, pulse 72, respiratory rate 20, blood pressure 114/71, weight is 232.  Head and neck:  Normocephalic, atraumatic skull.  There are no ocular or oral lesions.  There are no palpable cervical or supraclavicular lymph nodes. Lungs:  Clear bilaterally.  Cardiac:  Regular rate and rhythm with normal S1, S2.  There are no murmurs, rubs, or bruits.  Abdomen:  Soft with good bowel sounds.  There is no palpable abdominal mass.  No palpable hepatosplenomegaly is noted.  Axillary exam:  No bilateral axillary adenopathy.  Back:  No tenderness of the spine, ribs, or hips. Extremities:  No clubbing, cyanosis or edema.  LABORATORY STUDIES:  White cell count is 4.5, hemoglobin 14.3, hematocrit 39.7, platelet count 157.  IMPRESSION:  Stacey Livingston is a 45 year old white female with follicular large-cell lymphoma.  She is in remission.  She got in remission after chemotherapy.  She had 6 cycles of Treanda/Rituxan.  We will give her Rituxan today.  She does get rapid infusion.  The pharmacist will adjust the chemotherapy plan for rapid infusion.  Will plan to get her back in 2 more months for followup.  We will continue to pray hard for Stacey Livingston and her  family.    ______________________________ Josph Macho, M.D. PRE/MEDQ  D:  12/14/2010  T:  12/15/2010  Job:  [id number]

## 2011-02-15 ENCOUNTER — Other Ambulatory Visit (HOSPITAL_BASED_OUTPATIENT_CLINIC_OR_DEPARTMENT_OTHER): Payer: Managed Care, Other (non HMO) | Admitting: Lab

## 2011-02-15 ENCOUNTER — Ambulatory Visit (HOSPITAL_BASED_OUTPATIENT_CLINIC_OR_DEPARTMENT_OTHER): Payer: Managed Care, Other (non HMO)

## 2011-02-15 ENCOUNTER — Ambulatory Visit (HOSPITAL_BASED_OUTPATIENT_CLINIC_OR_DEPARTMENT_OTHER): Payer: Managed Care, Other (non HMO) | Admitting: Hematology & Oncology

## 2011-02-15 VITALS — BP 115/64 | HR 64 | Temp 97.3°F

## 2011-02-15 VITALS — BP 122/76 | HR 76 | Temp 98.1°F | Ht 67.0 in | Wt 229.8 lb

## 2011-02-15 DIAGNOSIS — C8299 Follicular lymphoma, unspecified, extranodal and solid organ sites: Secondary | ICD-10-CM

## 2011-02-15 DIAGNOSIS — C8280 Other types of follicular lymphoma, unspecified site: Secondary | ICD-10-CM

## 2011-02-15 DIAGNOSIS — Z5112 Encounter for antineoplastic immunotherapy: Secondary | ICD-10-CM

## 2011-02-15 LAB — LACTATE DEHYDROGENASE: LDH: 133 U/L (ref 94–250)

## 2011-02-15 LAB — CBC WITH DIFFERENTIAL (CANCER CENTER ONLY)
BASO#: 0 10*3/uL (ref 0.0–0.2)
HCT: 40.5 % (ref 34.8–46.6)
HGB: 14.4 g/dL (ref 11.6–15.9)
LYMPH#: 0.7 10*3/uL — ABNORMAL LOW (ref 0.9–3.3)
LYMPH%: 13.6 % — ABNORMAL LOW (ref 14.0–48.0)
MCHC: 35.6 g/dL (ref 32.0–36.0)
MCV: 84 fL (ref 81–101)
MONO#: 0.4 10*3/uL (ref 0.1–0.9)
NEUT%: 76.6 % (ref 39.6–80.0)
WBC: 5 10*3/uL (ref 3.9–10.0)

## 2011-02-15 LAB — COMPREHENSIVE METABOLIC PANEL
AST: 13 U/L (ref 0–37)
BUN: 15 mg/dL (ref 6–23)
Calcium: 9.2 mg/dL (ref 8.4–10.5)
Chloride: 106 mEq/L (ref 96–112)
Creatinine, Ser: 0.71 mg/dL (ref 0.50–1.10)
Total Bilirubin: 1 mg/dL (ref 0.3–1.2)

## 2011-02-15 MED ORDER — SODIUM CHLORIDE 0.9 % IV SOLN: Freq: Once | INTRAVENOUS | Status: DC

## 2011-02-15 MED ORDER — DIPHENHYDRAMINE HCL 25 MG PO CAPS
50.0000 mg | ORAL_CAPSULE | Freq: Once | ORAL | Status: AC
  Administered 2011-02-15: 50 mg via ORAL

## 2011-02-15 MED ORDER — SODIUM CHLORIDE 0.9 % IV SOLN
375.0000 mg/m2 | Freq: Once | INTRAVENOUS | Status: AC
  Administered 2011-02-15: 800 mg via INTRAVENOUS
  Filled 2011-02-15: qty 80

## 2011-02-15 MED ORDER — SODIUM CHLORIDE 0.9 % IV SOLN: 375.0000 mg/m2 | Freq: Once | INTRAVENOUS | Status: DC

## 2011-02-15 MED ORDER — SODIUM CHLORIDE 0.9 % IJ SOLN
10.0000 mL | INTRAMUSCULAR | Status: DC | PRN
  Administered 2011-02-15: 10 mL
  Filled 2011-02-15: qty 10

## 2011-02-15 MED ORDER — ACETAMINOPHEN 325 MG PO TABS
650.0000 mg | ORAL_TABLET | Freq: Once | ORAL | Status: AC
  Administered 2011-02-15: 650 mg via ORAL

## 2011-02-15 MED ORDER — HEPARIN SOD (PORK) LOCK FLUSH 100 UNIT/ML IV SOLN
500.0000 [IU] | Freq: Once | INTRAVENOUS | Status: AC | PRN
  Administered 2011-02-15: 500 [IU]
  Filled 2011-02-15: qty 5

## 2011-02-15 NOTE — Progress Notes (Signed)
This office note has been dictated.

## 2011-02-15 NOTE — Progress Notes (Signed)
CC:   Angelia Mould. Derrell Lolling, M.D.  DIAGNOSIS:  Follicular large-cell non-Hodgkin lymphoma.  CURRENT THERAPY:  Rituxan-maintenance q.2 months.  INTERIM HISTORY:  Ms. Neitzke comes in for followup.  She is doing okay. She had a good holiday.  Her kids are doing all right, although they have their own issues with respect to school. Ms. Jurewicz is trying to cope with all this.  She is doing a good job in Holiday representative.  She is really keeping herself together, although there is a lot of issues going on with her family.  Her husband is having problems, I think, from a car accident.  He also sleep apnea.  Ms. Poznanski is still working.  She is enjoying work.  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  She has had no problems with Rituxan.  There has been no problem with infections.  She has had no fever, sweats, or chills.  There has been no abdominal pain.  She has had no cough or shortness breath.  There has been no change in bowel or bladder habits.  She has not noticed any rashes.  PHYSICAL EXAM:  General: This is a well-developed, well-nourished, white female in no obvious distress.  Vital signs: Temperature 98.1, pulse 76, heart rate 20, blood pressure 122/76, and weight is 230.  Head and neck exam shows a normocephalic, atraumatic skull.  No ocular or oral lesions.  No palpable cervical or supraclavicular lymph nodes.  Lungs are clear bilaterally.  Cardiac examination:  Regular rate and rhythm with a normal S1 and S2.  There are no murmurs, rubs, or bruits. Abdominal exam: Soft with good bowel sounds.  There is no palpable abdominal mass.  There is no hepatosplenomegaly.  She has a well-healed laparoscopy wound in the right lower quadrant from her appendectomy. Back exam: No tenderness over the spine, ribs, or hips.  Extremities: Shows no clubbing, cyanosis, or edema.  Neurological exam: Shows no focal neurological deficits.  Skin exam: No rashes, ecchymosis, or petechia.  LABORATORY  STUDIES:  White count is 5, hemoglobin 14.4, hematocrit 48.5, and platelet count 142.  IMPRESSION:  Ms. Wolz is a 46 year old white female with follicular large-cell lymphoma.  She received systemic chemotherapy for this.  She had 6 cycles of Treanda/Rituxan.  She then started maintenance Rituxan in May 2001.  We will go ahead and plan to finish her up this year in May.  We will have her come back in 2 more months for her next Rituxan dose.  In the interim, I do not see any need for scans.  She is asymptomatic right now.   ______________________________ Josph Macho, M.D. PRE/MEDQ  D:  02/15/2011  T:  02/15/2011  Job:  [id number]

## 2011-04-05 ENCOUNTER — Encounter: Payer: Self-pay | Admitting: *Deleted

## 2011-04-05 NOTE — Progress Notes (Signed)
Orders completed for 02/06/11 per pharmacy request

## 2011-04-12 ENCOUNTER — Ambulatory Visit (HOSPITAL_BASED_OUTPATIENT_CLINIC_OR_DEPARTMENT_OTHER): Payer: Managed Care, Other (non HMO)

## 2011-04-12 ENCOUNTER — Other Ambulatory Visit (HOSPITAL_BASED_OUTPATIENT_CLINIC_OR_DEPARTMENT_OTHER): Payer: Managed Care, Other (non HMO) | Admitting: Lab

## 2011-04-12 ENCOUNTER — Other Ambulatory Visit: Payer: Self-pay | Admitting: Certified Registered Nurse Anesthetist

## 2011-04-12 ENCOUNTER — Ambulatory Visit (HOSPITAL_BASED_OUTPATIENT_CLINIC_OR_DEPARTMENT_OTHER): Payer: Managed Care, Other (non HMO) | Admitting: Hematology & Oncology

## 2011-04-12 VITALS — BP 102/71 | HR 65 | Temp 98.1°F | Ht 67.0 in | Wt 230.0 lb

## 2011-04-12 DIAGNOSIS — C8299 Follicular lymphoma, unspecified, extranodal and solid organ sites: Secondary | ICD-10-CM

## 2011-04-12 DIAGNOSIS — Z5112 Encounter for antineoplastic immunotherapy: Secondary | ICD-10-CM

## 2011-04-12 DIAGNOSIS — C8280 Other types of follicular lymphoma, unspecified site: Secondary | ICD-10-CM

## 2011-04-12 LAB — COMPREHENSIVE METABOLIC PANEL
Albumin: 4.3 g/dL (ref 3.5–5.2)
Alkaline Phosphatase: 59 U/L (ref 39–117)
BUN: 12 mg/dL (ref 6–23)
Calcium: 9.1 mg/dL (ref 8.4–10.5)
Chloride: 106 mEq/L (ref 96–112)
Glucose, Bld: 88 mg/dL (ref 70–99)
Potassium: 4.2 mEq/L (ref 3.5–5.3)
Sodium: 139 mEq/L (ref 135–145)
Total Protein: 6.2 g/dL (ref 6.0–8.3)

## 2011-04-12 LAB — CBC WITH DIFFERENTIAL (CANCER CENTER ONLY)
BASO#: 0 10*3/uL (ref 0.0–0.2)
Eosinophils Absolute: 0.1 10*3/uL (ref 0.0–0.5)
HGB: 13.9 g/dL (ref 11.6–15.9)
LYMPH#: 0.7 10*3/uL — ABNORMAL LOW (ref 0.9–3.3)
MONO#: 0.4 10*3/uL (ref 0.1–0.9)
MONO%: 8.5 % (ref 0.0–13.0)
NEUT#: 3.5 10*3/uL (ref 1.5–6.5)
Platelets: 152 10*3/uL (ref 145–400)
RBC: 4.69 10*6/uL (ref 3.70–5.32)
WBC: 4.7 10*3/uL (ref 3.9–10.0)

## 2011-04-12 MED ORDER — SODIUM CHLORIDE 0.9 % IV SOLN: 375.0000 mg/m2 | Freq: Once | INTRAVENOUS | Status: DC

## 2011-04-12 MED ORDER — SODIUM CHLORIDE 0.9 % IV SOLN
Freq: Once | INTRAVENOUS | Status: AC
  Administered 2011-04-12: 10:00:00 via INTRAVENOUS

## 2011-04-12 MED ORDER — SODIUM CHLORIDE 0.9 % IV SOLN
375.0000 mg/m2 | Freq: Once | INTRAVENOUS | Status: AC
  Administered 2011-04-12: 800 mg via INTRAVENOUS
  Filled 2011-04-12: qty 80

## 2011-04-12 MED ORDER — DIPHENHYDRAMINE HCL 25 MG PO CAPS
50.0000 mg | ORAL_CAPSULE | Freq: Once | ORAL | Status: AC
  Administered 2011-04-12: 25 mg via ORAL

## 2011-04-12 MED ORDER — ACETAMINOPHEN 325 MG PO TABS
650.0000 mg | ORAL_TABLET | Freq: Once | ORAL | Status: AC
  Administered 2011-04-12: 650 mg via ORAL

## 2011-04-12 MED ORDER — HEPARIN SOD (PORK) LOCK FLUSH 100 UNIT/ML IV SOLN
500.0000 [IU] | Freq: Once | INTRAVENOUS | Status: AC | PRN
  Administered 2011-04-12: 500 [IU]
  Filled 2011-04-12: qty 5

## 2011-04-12 MED ORDER — SODIUM CHLORIDE 0.9 % IJ SOLN
10.0000 mL | INTRAMUSCULAR | Status: DC | PRN
  Administered 2011-04-12: 10 mL
  Filled 2011-04-12: qty 10

## 2011-04-12 NOTE — Progress Notes (Signed)
This office note has been dictated.

## 2011-04-12 NOTE — Progress Notes (Signed)
CC:   Angelia Mould. Derrell Lolling, M.D. Kendrick Ranch, M.D.  DIAGNOSIS:  Follicular large cell non-Hodgkin lymphoma, remission.  CURRENT THERAPY:  Maintenance Rituxan every 2 months.  INTERVAL HISTORY:  Ms. Ottey comes in for her followup.  She is getting close to the end of her maintenance Rituxan.  She started this back in May 2011.  She has 1 more treatment after this.  She has had no complaints.  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  She is looking forward to this.  She is still working without any difficulties.  PHYSICAL EXAMINATION:  General Appearance:  This is a well-developed, well-nourished, white female in no obvious distress.  Vital Signs: 98.1, pulse 65, respiratory rate 16, blood pressure 102/71.  Weight is 230.  Head and Neck Exam:  A normocephalic, atraumatic skull.  There are no ocular or oral lesions.  There are no palpable cervical or supraclavicular lymph nodes.  Lungs:  Clear bilaterally.  Cardiac Exam: Regular rate and rhythm with a normal S1 and S2.  There are no murmurs, rubs, or bruits.  Abdominal Exam:  Soft with good bowel sounds.  There is no palpable abdominal mass.  There is no fluid wave.  There is no palpable hepatosplenomegaly.  Back Exam:  No tenderness over the spine, ribs, or hips.  Extremities:  No clubbing, cyanosis, or edema.  Axillary Exam:  Right axilla with no lymphadenopathy.  On the left axilla, there maybe a palpable lymph node, which is mobile and nontender.  LABORATORIES STUDIES:  White cell count is 4.7, hemoglobin 14, hematocrit 39.5, platelet count 152.  IMPRESSION:  Ms. Warnke is a 46 year old, white female with follicular large cell lymphoma.  She went into remission with chemotherapy with Treanda/Rituxan.  We then started on her Rituxan as a maintenance therapy.  Again, she will finish up in May.  We will plan for a followup in May.  After that, we will plan just to follow her along.  We may talk about getting  her Port-A-Cath out.  Ms. Mcenroe does need to have a family practice physician.  I recommend Dr. Lonell Face office, whom I think would be a good fit for Ms Andalon.    ______________________________ Josph Macho, M.D. PRE/MEDQ  D:  04/12/2011  T:  04/12/2011  Job:  [id number]

## 2011-04-12 NOTE — Patient Instructions (Signed)

## 2011-06-11 ENCOUNTER — Other Ambulatory Visit: Payer: Self-pay | Admitting: *Deleted

## 2011-06-11 ENCOUNTER — Encounter: Payer: Self-pay | Admitting: *Deleted

## 2011-06-11 DIAGNOSIS — C8280 Other types of follicular lymphoma, unspecified site: Secondary | ICD-10-CM

## 2011-06-11 MED ORDER — LIDOCAINE-PRILOCAINE 2.5-2.5 % EX CREA: TOPICAL_CREAM | CUTANEOUS | Status: DC

## 2011-06-11 NOTE — Telephone Encounter (Signed)
Pt called requesting an Emla cream refill. She has a treatment on Monday. Sent rx via eprescribe to [facility] in [place].

## 2011-06-14 ENCOUNTER — Ambulatory Visit (HOSPITAL_BASED_OUTPATIENT_CLINIC_OR_DEPARTMENT_OTHER): Payer: Managed Care, Other (non HMO)

## 2011-06-14 ENCOUNTER — Other Ambulatory Visit (HOSPITAL_BASED_OUTPATIENT_CLINIC_OR_DEPARTMENT_OTHER): Payer: Managed Care, Other (non HMO) | Admitting: Lab

## 2011-06-14 ENCOUNTER — Ambulatory Visit (HOSPITAL_BASED_OUTPATIENT_CLINIC_OR_DEPARTMENT_OTHER): Payer: Managed Care, Other (non HMO) | Admitting: Hematology & Oncology

## 2011-06-14 VITALS — BP 128/67 | HR 82 | Temp 97.0°F

## 2011-06-14 VITALS — BP 104/70 | HR 66 | Temp 97.7°F | Ht 67.0 in | Wt 236.0 lb

## 2011-06-14 DIAGNOSIS — C8299 Follicular lymphoma, unspecified, extranodal and solid organ sites: Secondary | ICD-10-CM

## 2011-06-14 DIAGNOSIS — C8590 Non-Hodgkin lymphoma, unspecified, unspecified site: Secondary | ICD-10-CM

## 2011-06-14 DIAGNOSIS — C8280 Other types of follicular lymphoma, unspecified site: Secondary | ICD-10-CM

## 2011-06-14 DIAGNOSIS — M810 Age-related osteoporosis without current pathological fracture: Secondary | ICD-10-CM

## 2011-06-14 DIAGNOSIS — Z5112 Encounter for antineoplastic immunotherapy: Secondary | ICD-10-CM

## 2011-06-14 LAB — CBC WITH DIFFERENTIAL (CANCER CENTER ONLY)
Eosinophils Absolute: 0.1 10*3/uL (ref 0.0–0.5)
HCT: 40.8 % (ref 34.8–46.6)
HGB: 14.6 g/dL (ref 11.6–15.9)
LYMPH#: 0.8 10*3/uL — ABNORMAL LOW (ref 0.9–3.3)
LYMPH%: 15.9 % (ref 14.0–48.0)
MCV: 83 fL (ref 81–101)
MONO#: 0.5 10*3/uL (ref 0.1–0.9)
NEUT%: 71.6 % (ref 39.6–80.0)
RBC: 4.9 10*6/uL (ref 3.70–5.32)
WBC: 4.8 10*3/uL (ref 3.9–10.0)

## 2011-06-14 MED ORDER — SODIUM CHLORIDE 0.9 % IV SOLN
800.0000 mg | Freq: Once | INTRAVENOUS | Status: AC
  Administered 2011-06-14: 800 mg via INTRAVENOUS
  Filled 2011-06-14: qty 80

## 2011-06-14 MED ORDER — SODIUM CHLORIDE 0.9 % IJ SOLN
10.0000 mL | INTRAMUSCULAR | Status: DC | PRN
  Administered 2011-06-14: 10 mL
  Filled 2011-06-14: qty 10

## 2011-06-14 MED ORDER — ACETAMINOPHEN 325 MG PO TABS
650.0000 mg | ORAL_TABLET | Freq: Once | ORAL | Status: AC
  Administered 2011-06-14: 650 mg via ORAL

## 2011-06-14 MED ORDER — SODIUM CHLORIDE 0.9 % IV SOLN
Freq: Once | INTRAVENOUS | Status: AC
  Administered 2011-06-14: 10:00:00 via INTRAVENOUS

## 2011-06-14 MED ORDER — DIPHENHYDRAMINE HCL 25 MG PO CAPS
50.0000 mg | ORAL_CAPSULE | Freq: Once | ORAL | Status: AC
  Administered 2011-06-14: 25 mg via ORAL

## 2011-06-14 MED ORDER — HEPARIN SOD (PORK) LOCK FLUSH 100 UNIT/ML IV SOLN
500.0000 [IU] | Freq: Once | INTRAVENOUS | Status: AC | PRN
  Administered 2011-06-14: 500 [IU]
  Filled 2011-06-14: qty 5

## 2011-06-14 MED ORDER — SODIUM CHLORIDE 0.9 % IV SOLN: 375.0000 mg/m2 | Freq: Once | INTRAVENOUS | Status: DC

## 2011-06-14 NOTE — Patient Instructions (Signed)

## 2011-06-14 NOTE — Progress Notes (Signed)
This office note has been dictated.

## 2011-06-15 NOTE — Progress Notes (Signed)
CC:   Stacey Livingston. Stacey Livingston, M.D.  DIAGNOSIS:  Follicular large-cell non-Hodgkin lymphoma, clinical remission.  CURRENT THERAPY:  The patient to complete her 2 years of Rituxan today.  INTERIM HISTORY:  Stacey Livingston comes in for followup.  Stacey Livingston is still enjoying her [employer].  Stacey Livingston really enjoys practices.  Stacey Livingston just enjoys being with the other women and everybody having a good time.  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  Her family is doing okay.  They have their issues on occasion, but Stacey Livingston really has her "act together" and keeps her family together.  Stacey Livingston has done well with Rituxan.  Stacey Livingston has really had very few problems with the Rituxan.  There are no arthralgias or myalgias.  There have been no skin rashes.  He has had no fevers, sweats or chills.  Stacey Livingston has had no nausea or vomiting.  PHYSICAL EXAMINATION:  This is a well-developed, well-nourished white female in no obvious distress.  Vital signs:  Temperature 97.7, pulse 66, respiratory rate 20, blood pressure 104/70.  Weight is 236.  Head and neck:  A normocephalic, atraumatic skull.  There are no ocular or oral lesions.  There are no palpable cervical or supraclavicular lymph nodes.  Lungs:  Clear to percussion and auscultation bilaterally. Cardiac:  Regular rate and rhythm with a normal S1 and S2.  There are no murmurs, rubs or bruits.  Abdomen:  Soft with good bowel sounds.  There is no palpable abdominal mass.  There is no fluid wave.  No palpable hepatosplenomegaly is noted.  Back:  No tenderness over the spine, ribs, or hips.  Extremities:  No clubbing, cyanosis or edema.  Neurologic:  No focal neurological deficits.  LABORATORY STUDIES:  White cell count is 4.8, hemoglobin 14.6, hematocrit 41, platelet count 151.  IMPRESSION:  Stacey Livingston is a 46 year old white female with follicular large-cell lymphoma.  Stacey Livingston got into remission after induction chemotherapy with 6 cycles of Treanda/Rituxan.  Stacey Livingston now  will complete her maintenance Rituxan today.  We will go ahead and plan to have her come back probably in about 3-4 months.  I do not see need for any scans on her.  Stacey Livingston is asymptomatic. I just do not think we would be adding anything to her followup by doing scans unless Stacey Livingston is symptomatic.  I am just so proud of Stacey Livingston.  Stacey Livingston is a tough woman.    ______________________________ Josph Macho, M.D. PRE/MEDQ  D:  06/14/2011  T:  06/15/2011  Job:  [id number]

## 2011-07-26 ENCOUNTER — Ambulatory Visit (HOSPITAL_BASED_OUTPATIENT_CLINIC_OR_DEPARTMENT_OTHER): Payer: Managed Care, Other (non HMO)

## 2011-07-26 VITALS — BP 122/78 | HR 78 | Temp 97.1°F

## 2011-07-26 DIAGNOSIS — C8299 Follicular lymphoma, unspecified, extranodal and solid organ sites: Secondary | ICD-10-CM

## 2011-07-26 DIAGNOSIS — Z452 Encounter for adjustment and management of vascular access device: Secondary | ICD-10-CM

## 2011-07-26 MED ORDER — HEPARIN SOD (PORK) LOCK FLUSH 100 UNIT/ML IV SOLN
500.0000 [IU] | Freq: Once | INTRAVENOUS | Status: AC
  Administered 2011-07-26: 500 [IU] via INTRAVENOUS
  Filled 2011-07-26: qty 5

## 2011-07-26 MED ORDER — SODIUM CHLORIDE 0.9 % IJ SOLN
10.0000 mL | INTRAMUSCULAR | Status: DC | PRN
  Administered 2011-07-26: 10 mL via INTRAVENOUS
  Filled 2011-07-26: qty 10

## 2011-08-09 ENCOUNTER — Ambulatory Visit: Payer: Managed Care, Other (non HMO) | Admitting: Hematology & Oncology

## 2011-08-09 ENCOUNTER — Other Ambulatory Visit: Payer: Managed Care, Other (non HMO) | Admitting: Lab

## 2011-08-09 ENCOUNTER — Ambulatory Visit: Payer: Managed Care, Other (non HMO)

## 2011-10-04 ENCOUNTER — Other Ambulatory Visit (HOSPITAL_BASED_OUTPATIENT_CLINIC_OR_DEPARTMENT_OTHER): Payer: Managed Care, Other (non HMO) | Admitting: Lab

## 2011-10-04 ENCOUNTER — Ambulatory Visit: Payer: Managed Care, Other (non HMO) | Admitting: Hematology & Oncology

## 2011-10-04 ENCOUNTER — Other Ambulatory Visit: Payer: Managed Care, Other (non HMO) | Admitting: Lab

## 2011-10-04 ENCOUNTER — Ambulatory Visit: Payer: Managed Care, Other (non HMO)

## 2011-10-05 NOTE — Progress Notes (Unsigned)
Stacey Livingston presented for Portacath access and flush. Proper placement of portacath confirmed by CXR. Portacath located in the {RIGHT/LEFT:20294} chest wall accessed with  {CHL ONC AP PORTACATH NEEDLE:115400403} needle. {CVC SITE ASSESSMENT:115400500} {CHL ONC AP PORTACATH BLOOD OZHYQM:578469629} Portacath flushed with 20ml NS and 500U/24ml Heparin per protocol and needle removed intact. Procedure without incident. Patient tolerated procedure well.

## 2011-10-06 ENCOUNTER — Ambulatory Visit: Payer: Managed Care, Other (non HMO)

## 2011-10-06 ENCOUNTER — Ambulatory Visit (HOSPITAL_BASED_OUTPATIENT_CLINIC_OR_DEPARTMENT_OTHER): Payer: Managed Care, Other (non HMO) | Admitting: Medical

## 2011-10-06 ENCOUNTER — Other Ambulatory Visit (HOSPITAL_BASED_OUTPATIENT_CLINIC_OR_DEPARTMENT_OTHER): Payer: Managed Care, Other (non HMO) | Admitting: Lab

## 2011-10-06 VITALS — BP 117/66 | HR 63 | Temp 98.1°F | Resp 20 | Ht 67.0 in | Wt 228.0 lb

## 2011-10-06 DIAGNOSIS — J069 Acute upper respiratory infection, unspecified: Secondary | ICD-10-CM

## 2011-10-06 DIAGNOSIS — C8299 Follicular lymphoma, unspecified, extranodal and solid organ sites: Secondary | ICD-10-CM

## 2011-10-06 DIAGNOSIS — C8280 Other types of follicular lymphoma, unspecified site: Secondary | ICD-10-CM

## 2011-10-06 LAB — CBC WITH DIFFERENTIAL (CANCER CENTER ONLY)
BASO#: 0 10*3/uL (ref 0.0–0.2)
Eosinophils Absolute: 0.1 10*3/uL (ref 0.0–0.5)
HCT: 37.6 % (ref 34.8–46.6)
HGB: 13.1 g/dL (ref 11.6–15.9)
LYMPH#: 0.7 10*3/uL — ABNORMAL LOW (ref 0.9–3.3)
LYMPH%: 11.6 % — ABNORMAL LOW (ref 14.0–48.0)
MCV: 85 fL (ref 81–101)
MONO#: 0.5 10*3/uL (ref 0.1–0.9)
NEUT%: 78.3 % (ref 39.6–80.0)
WBC: 6.1 10*3/uL (ref 3.9–10.0)

## 2011-10-06 MED ORDER — HEPARIN SOD (PORK) LOCK FLUSH 100 UNIT/ML IV SOLN
500.0000 [IU] | Freq: Once | INTRAVENOUS | Status: AC
  Administered 2011-10-06: 500 [IU] via INTRAVENOUS
  Filled 2011-10-06: qty 5

## 2011-10-06 MED ORDER — SODIUM CHLORIDE 0.9 % IJ SOLN
10.0000 mL | INTRAMUSCULAR | Status: DC | PRN
  Administered 2011-10-06: 10 mL via INTRAVENOUS
  Filled 2011-10-06: qty 10

## 2011-10-06 NOTE — Patient Instructions (Signed)

## 2011-10-06 NOTE — Progress Notes (Signed)
Diagnosis: Follicular, large cell non-Hodgkin lymphoma, clinical remission.  Past therapy: #1 .  Status post  6 cycles of Treanda/Rituxan.   #2 The patient completed 2 years of maintenance  Rituxan as of 06/15/2011.  Interim history: Stacey Livingston comes in today for an office followup visit.  Overall, she, reports, that she's been doing relatively well except for some allergy symptoms, that she's had for about 3 weeks now.  She denies any fevers, chest pain, or shortness of breath.  She does have an intermittent cough, and utilizes, Mucinex.  She does have some yellowish sputum.  She has not reported any fevers, night sweats, or any unintentional weight loss.  She denies any type of lymphadenopathy.  She denies a sore throat.  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  She reports, she has a good appetite.  She denies any nausea, vomiting, diarrhea, constipation, any, headaches, visual changes, or rashes.  She denies any type of obvious, or abnormal bleeding.  Review of Systems: intermittent cough with yellowish sputum, otherwise: Pt. Denies any changes in their vision, hearing, adenopathy, fevers, chills, nausea, vomiting, diarrhea, constipation, chest pain, shortness of breath, passing blood, passing out, blacking out,  any changes in skin, joints, neurologic or psychiatric except as noted.  Physical Exam: This is a pleasant, 46 year old, well-developed, well-nourished, white female, in no obvious distress Vitals: Temperature 90.1 degrees, pulse 63, respirations 20, blood pressure 117/66, weight 228 pounds HEENT reveals a normocephalic, atraumatic skull, no scleral icterus, no oral lesions  Neck is supple without any cervical or supraclavicular adenopathy.  Lungs are clear to auscultation bilaterally. There are no wheezes, rales or rhonci Cardiac is regular rate and rhythm with a normal S1 and S2. There are no  murmurs, rubs, or bruits.  Abdomen is soft with good bowel sounds, there is no palpable mass. There is no palpable hepatosplenomegaly. There is no palpable fluid wave.  Musculoskeletal no tenderness of the spine, ribs, or hips.  Extremities there are no clubbing, cyanosis, or edema.  Skin no petechia, purpura or ecchymosis Neurologic is nonfocal.  Laboratory Data: White count 6.1, hemoglobin 13.1, hematocrit 37.6, platelets 191,000  Current Outpatient Prescriptions on File Prior to Visit  Medication Sig Dispense Refill  . calcium carbonate 1250 MG capsule Take 1,250 mg by mouth daily.        . cholecalciferol (VITAMIN D) 1000 UNITS tablet Take 1,000 Units by mouth daily.        Marland Kitchen lidocaine-prilocaine (EMLA) cream Apply cotton ball size amt to port and cover with plastic wrap 1 hour prior to use.  30 g  1  . Multiple Vitamin (MULTIVITAMIN) tablet Take 1 tablet by mouth daily.         No current facility-administered medications on file prior to visit.   Assessment/Plan: This is a very pleasant, 46 year old, white female, with the following issues:  #1.  History of follicular, large cell lymphoma.  She did go into remission after induction chemotherapy with 6 cycles of tree and/Rituxan.  She completed her maintenance Rituxan.  On 06/15/2011.  She does not appear to have any type of clinical relapse.  We will continue to monitor every 6 months.  She will continue to have Port-A-Cath flushes every 8 weeks.  #2 allergies/upper respiratory symptoms-I. did go ahead and give her prescription for a Z-Pak.  I informed her that once her antibiotic is  complete if her symptoms are still there to get back in touch with [place].  #3 followup we will follow back up with Stacey Livingston in 6 months, but before then should there be questions or concerns.

## 2011-10-07 ENCOUNTER — Telehealth: Payer: Self-pay | Admitting: Oncology

## 2011-10-07 LAB — VITAMIN D 25 HYDROXY (VIT D DEFICIENCY, FRACTURES): Vit D, 25-Hydroxy: 22 ng/mL — ABNORMAL LOW (ref 30–89)

## 2011-10-07 LAB — COMPREHENSIVE METABOLIC PANEL
ALT: 12 U/L (ref 0–35)
BUN: 10 mg/dL (ref 6–23)
CO2: 29 mEq/L (ref 19–32)
Calcium: 9.1 mg/dL (ref 8.4–10.5)
Chloride: 105 mEq/L (ref 96–112)
Creatinine, Ser: 0.7 mg/dL (ref 0.50–1.10)
Glucose, Bld: 86 mg/dL (ref 70–99)
Total Bilirubin: 0.9 mg/dL (ref 0.3–1.2)

## 2011-10-07 LAB — LACTATE DEHYDROGENASE: LDH: 136 U/L (ref 94–250)

## 2011-10-07 NOTE — Telephone Encounter (Signed)
Message copied by Lacie Draft on Thu Oct 07, 2011  8:48 AM ------      Message from: Arlan Organ R      Created: Thu Oct 07, 2011  8:12 AM       Call - Vit D is LOW.  How much is she taking??  Cindee Lame

## 2011-10-11 ENCOUNTER — Telehealth: Payer: Self-pay | Admitting: *Deleted

## 2011-10-11 NOTE — Telephone Encounter (Signed)
Left message on patient personal cell phone letting her know that her vitamin d levels are very low per dr. Myna Hidalgo.   According to our record patient is taking 1000 u daily. I asked patient to  Call us back to let us know how much she is taking.  Told patient she at least needs to be takign 2000 u daily per dr. Lupita Leash.

## 2011-10-11 NOTE — Telephone Encounter (Signed)
Message copied by Anselm Jungling on Mon Oct 11, 2011  9:45 AM ------      Message from: Arlan Organ R      Created: Thu Oct 07, 2011  8:12 AM       Call - Vit D is LOW.  How much is she taking??  Cindee Lame

## 2011-12-01 ENCOUNTER — Ambulatory Visit (HOSPITAL_BASED_OUTPATIENT_CLINIC_OR_DEPARTMENT_OTHER): Payer: Managed Care, Other (non HMO)

## 2011-12-01 VITALS — BP 114/63 | HR 73 | Temp 98.0°F | Resp 18

## 2011-12-01 DIAGNOSIS — C8590 Non-Hodgkin lymphoma, unspecified, unspecified site: Secondary | ICD-10-CM

## 2011-12-01 DIAGNOSIS — Z452 Encounter for adjustment and management of vascular access device: Secondary | ICD-10-CM

## 2011-12-01 DIAGNOSIS — C8299 Follicular lymphoma, unspecified, extranodal and solid organ sites: Secondary | ICD-10-CM

## 2011-12-01 MED ORDER — SODIUM CHLORIDE 0.9 % IJ SOLN
10.0000 mL | INTRAMUSCULAR | Status: DC | PRN
  Administered 2011-12-01: 10 mL via INTRAVENOUS
  Filled 2011-12-01: qty 10

## 2011-12-01 MED ORDER — HEPARIN SOD (PORK) LOCK FLUSH 100 UNIT/ML IV SOLN
500.0000 [IU] | Freq: Once | INTRAVENOUS | Status: AC
  Administered 2011-12-01: 500 [IU] via INTRAVENOUS
  Filled 2011-12-01: qty 5

## 2011-12-01 MED ORDER — HEPARIN SOD (PORK) LOCK FLUSH 100 UNIT/ML IV SOLN
500.0000 [IU] | Freq: Once | INTRAVENOUS | Status: DC
  Filled 2011-12-01: qty 5

## 2011-12-01 MED ORDER — SODIUM CHLORIDE 0.9 % IJ SOLN
10.0000 mL | INTRAMUSCULAR | Status: DC | PRN
  Filled 2011-12-01: qty 10

## 2012-02-02 ENCOUNTER — Ambulatory Visit: Payer: Managed Care, Other (non HMO)

## 2012-02-02 ENCOUNTER — Ambulatory Visit: Payer: Managed Care, Other (non HMO) | Admitting: Hematology & Oncology

## 2012-02-02 ENCOUNTER — Other Ambulatory Visit: Payer: Managed Care, Other (non HMO) | Admitting: Lab

## 2012-02-02 ENCOUNTER — Other Ambulatory Visit (HOSPITAL_BASED_OUTPATIENT_CLINIC_OR_DEPARTMENT_OTHER): Payer: Managed Care, Other (non HMO) | Admitting: Lab

## 2012-02-02 ENCOUNTER — Ambulatory Visit (HOSPITAL_BASED_OUTPATIENT_CLINIC_OR_DEPARTMENT_OTHER): Payer: Managed Care, Other (non HMO) | Admitting: Medical

## 2012-02-02 VITALS — BP 105/63 | HR 70 | Temp 97.8°F | Resp 16 | Ht 67.0 in | Wt 227.0 lb

## 2012-02-02 DIAGNOSIS — C8299 Follicular lymphoma, unspecified, extranodal and solid organ sites: Secondary | ICD-10-CM

## 2012-02-02 DIAGNOSIS — C8280 Other types of follicular lymphoma, unspecified site: Secondary | ICD-10-CM

## 2012-02-02 DIAGNOSIS — E559 Vitamin D deficiency, unspecified: Secondary | ICD-10-CM

## 2012-02-02 LAB — CBC WITH DIFFERENTIAL (CANCER CENTER ONLY)
EOS%: 3.7 % (ref 0.0–7.0)
LYMPH%: 13.7 % — ABNORMAL LOW (ref 14.0–48.0)
MCH: 29.2 pg (ref 26.0–34.0)
MCHC: 35.7 g/dL (ref 32.0–36.0)
MCV: 82 fL (ref 81–101)
MONO%: 9.2 % (ref 0.0–13.0)
NEUT#: 3.8 10*3/uL (ref 1.5–6.5)
Platelets: 180 10*3/uL (ref 145–400)
RBC: 5.06 10*6/uL (ref 3.70–5.32)
RDW: 13.2 % (ref 11.1–15.7)

## 2012-02-02 LAB — LACTATE DEHYDROGENASE: LDH: 128 U/L (ref 94–250)

## 2012-02-02 LAB — COMPREHENSIVE METABOLIC PANEL
ALT: 15 U/L (ref 0–35)
Alkaline Phosphatase: 80 U/L (ref 39–117)
Creatinine, Ser: 0.74 mg/dL (ref 0.50–1.10)
Sodium: 139 mEq/L (ref 135–145)
Total Bilirubin: 1.1 mg/dL (ref 0.3–1.2)
Total Protein: 6.1 g/dL (ref 6.0–8.3)

## 2012-02-02 MED ORDER — HEPARIN SOD (PORK) LOCK FLUSH 100 UNIT/ML IV SOLN
500.0000 [IU] | Freq: Once | INTRAVENOUS | Status: AC | PRN
  Administered 2012-02-02: 500 [IU] via INTRAVENOUS
  Filled 2012-02-02: qty 5

## 2012-02-02 MED ORDER — ALTEPLASE 2 MG IJ SOLR
2.0000 mg | Freq: Once | INTRAMUSCULAR | Status: DC | PRN
  Filled 2012-02-02: qty 2

## 2012-02-02 MED ORDER — SODIUM CHLORIDE 0.9 % IJ SOLN
10.0000 mL | INTRAMUSCULAR | Status: DC | PRN
  Administered 2012-02-02: 10 mL via INTRAVENOUS
  Filled 2012-02-02: qty 10

## 2012-02-02 NOTE — Patient Instructions (Signed)

## 2012-02-02 NOTE — Progress Notes (Signed)
Diagnosis: Follicular, large cell non-Hodgkin lymphoma, clinical remission.  Past therapy: #1 .  Status post  6 cycles of Treanda/Rituxan.  #2 The patient completed 2 years of maintenance  Rituxan as of 06/15/2011.  Current therapy: Observation  Interim history: Stacey Livingston comes in today for an office followup visit.  Overall, she, reports, that she's been doing relatively well.  She does not report any new problems or new medications.  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  She denies any cough, chest pain, or shortness of breath.  She denies any fevers, chills, or night sweats.  She denies any unintentional weight loss.  She denies any type of lymphadenopathy.  She reports, she has a good appetite.  She denies any nausea, vomiting, diarrhea, constipation, any, headaches, visual changes, or rashes.  She denies any type of obvious, or abnormal bleeding.  Overall, Stacey Livingston is in good spirits.  Review of Systems: Constitutional:Negative for malaise/fatigue, fever, chills, weight loss, diaphoresis, activity change, appetite change, and unexpected weight change.  HEENT: Negative for double vision, blurred vision, visual loss, ear pain, tinnitus, congestion, rhinorrhea, epistaxis sore throat or sinus disease, oral pain/lesion, tongue soreness Respiratory: Negative for cough, chest tightness, shortness of breath, wheezing and stridor.  Cardiovascular: Negative for chest pain, palpitations, leg swelling, orthopnea, PND, DOE or claudication Gastrointestinal: Negative for nausea, vomiting, abdominal pain, diarrhea, constipation, blood in stool, melena, hematochezia, abdominal distention, anal bleeding, rectal pain, anorexia and hematemesis.  Genitourinary: Negative for dysuria, frequency, hematuria,  Musculoskeletal: Negative for myalgias, back pain, joint swelling, arthralgias and gait problem.  Skin: Negative for rash, color change, pallor  and wound.  Neurological:. Negative for dizziness/light-headedness, tremors, seizures, syncope, facial asymmetry, speech difficulty, weakness, numbness, headaches and paresthesias.  Hematological: Negative for adenopathy. Does not bruise/bleed easily.  Psychiatric/Behavioral:  Negative for depression, no loss of interest in normal activity or change in sleep pattern.   Physical Exam: This is a pleasant, 47 year old, well-developed, well-nourished, white female, in no obvious distress Vitals:  temperature 97.8 degrees, pulse 70, respirations 16, blood pressure 105/63, weight 227 pounds  HEENT reveals a normocephalic, atraumatic skull, no scleral icterus, no oral lesions  Neck is supple without any cervical or supraclavicular adenopathy.  Lungs are clear to auscultation bilaterally. There are no wheezes, rales or rhonci Cardiac is regular rate and rhythm with a normal S1 and S2. There are no murmurs, rubs, or bruits.  Abdomen is soft with good bowel sounds, there is no palpable mass. There is no palpable hepatosplenomegaly. There is no palpable fluid wave.  Musculoskeletal no tenderness of the spine, ribs, or hips.  Extremities there are no clubbing, cyanosis, or edema.  Skin no petechia, purpura or ecchymosis Neurologic is nonfocal.  Laboratory Data:  white count 5.2, hemoglobin 14.8, hematocrit 41.4, platelets 180,000   Current Outpatient Prescriptions on File Prior to Visit  Medication Sig Dispense Refill  . calcium carbonate 1250 MG capsule Take 1,250 mg by mouth daily.        . cholecalciferol (VITAMIN D) 1000 UNITS tablet Take 1,000 Units by mouth daily.        Marland Kitchen lidocaine-prilocaine (EMLA) cream Apply cotton ball size amt to port and cover with plastic wrap 1 hour prior to use.  30 g  1  . Multiple Vitamin (MULTIVITAMIN) tablet Take 1 tablet by mouth daily.         Assessment/Plan: This is a very pleasant, 47 year old, white female,  with the following issues:  #1.  History of  follicular, large cell lymphoma.  She did go into remission after induction chemotherapy with 6 cycles of Treanda/Rituxan.  She completed her maintenance Rituxan.  On 06/15/2011.  She does not appear to have any type of clinical relapse.  We will continue to monitor every 6 months.  She will continue to have Port-A-Cath flushes every 8 weeks.  #2 followup we will follow back up with Stacey Livingston in 6 months, but before then should there be questions or concerns.

## 2012-03-13 ENCOUNTER — Ambulatory Visit (HOSPITAL_BASED_OUTPATIENT_CLINIC_OR_DEPARTMENT_OTHER): Payer: Managed Care, Other (non HMO)

## 2012-03-13 DIAGNOSIS — Z452 Encounter for adjustment and management of vascular access device: Secondary | ICD-10-CM

## 2012-03-13 DIAGNOSIS — C8280 Other types of follicular lymphoma, unspecified site: Secondary | ICD-10-CM

## 2012-03-13 DIAGNOSIS — C8299 Follicular lymphoma, unspecified, extranodal and solid organ sites: Secondary | ICD-10-CM

## 2012-03-13 MED ORDER — SODIUM CHLORIDE 0.9 % IJ SOLN
10.0000 mL | INTRAMUSCULAR | Status: DC | PRN
  Administered 2012-03-13: 10 mL via INTRAVENOUS
  Filled 2012-03-13: qty 10

## 2012-03-13 MED ORDER — HEPARIN SOD (PORK) LOCK FLUSH 100 UNIT/ML IV SOLN
500.0000 [IU] | Freq: Once | INTRAVENOUS | Status: AC | PRN
  Administered 2012-03-13: 500 [IU] via INTRAVENOUS
  Filled 2012-03-13: qty 5

## 2012-03-13 NOTE — Patient Instructions (Signed)

## 2012-03-15 ENCOUNTER — Ambulatory Visit: Payer: Managed Care, Other (non HMO) | Admitting: Hematology & Oncology

## 2012-03-15 ENCOUNTER — Other Ambulatory Visit: Payer: Managed Care, Other (non HMO) | Admitting: Lab

## 2012-03-24 ENCOUNTER — Ambulatory Visit: Payer: Managed Care, Other (non HMO) | Admitting: Hematology & Oncology

## 2012-03-24 ENCOUNTER — Other Ambulatory Visit: Payer: Managed Care, Other (non HMO) | Admitting: Lab

## 2012-05-01 ENCOUNTER — Ambulatory Visit (HOSPITAL_BASED_OUTPATIENT_CLINIC_OR_DEPARTMENT_OTHER): Payer: Managed Care, Other (non HMO)

## 2012-05-01 VITALS — BP 128/73 | HR 74 | Temp 97.2°F

## 2012-05-01 DIAGNOSIS — C8280 Other types of follicular lymphoma, unspecified site: Secondary | ICD-10-CM

## 2012-05-01 DIAGNOSIS — C8299 Follicular lymphoma, unspecified, extranodal and solid organ sites: Secondary | ICD-10-CM

## 2012-05-01 DIAGNOSIS — Z452 Encounter for adjustment and management of vascular access device: Secondary | ICD-10-CM

## 2012-05-01 MED ORDER — ALTEPLASE 2 MG IJ SOLR
2.0000 mg | Freq: Once | INTRAMUSCULAR | Status: DC | PRN
  Filled 2012-05-01: qty 2

## 2012-05-01 MED ORDER — HEPARIN SOD (PORK) LOCK FLUSH 100 UNIT/ML IV SOLN
500.0000 [IU] | Freq: Once | INTRAVENOUS | Status: AC | PRN
  Administered 2012-05-01: 500 [IU] via INTRAVENOUS
  Filled 2012-05-01: qty 5

## 2012-05-01 MED ORDER — SODIUM CHLORIDE 0.9 % IJ SOLN
10.0000 mL | INTRAMUSCULAR | Status: DC | PRN
  Administered 2012-05-01: 10 mL via INTRAVENOUS
  Filled 2012-05-01: qty 10

## 2012-05-01 NOTE — Patient Instructions (Signed)

## 2012-06-12 ENCOUNTER — Ambulatory Visit (HOSPITAL_BASED_OUTPATIENT_CLINIC_OR_DEPARTMENT_OTHER): Payer: Managed Care, Other (non HMO)

## 2012-06-12 VITALS — BP 122/76 | HR 72 | Temp 96.6°F

## 2012-06-12 DIAGNOSIS — C8280 Other types of follicular lymphoma, unspecified site: Secondary | ICD-10-CM

## 2012-06-12 DIAGNOSIS — Z452 Encounter for adjustment and management of vascular access device: Secondary | ICD-10-CM

## 2012-06-12 DIAGNOSIS — C8299 Follicular lymphoma, unspecified, extranodal and solid organ sites: Secondary | ICD-10-CM

## 2012-06-12 MED ORDER — ALTEPLASE 2 MG IJ SOLR
2.0000 mg | Freq: Once | INTRAMUSCULAR | Status: DC | PRN
Start: 2012-06-12 — End: 2012-06-12
  Filled 2012-06-12: qty 2

## 2012-06-12 MED ORDER — HEPARIN SOD (PORK) LOCK FLUSH 100 UNIT/ML IV SOLN
500.0000 [IU] | Freq: Once | INTRAVENOUS | Status: AC | PRN
  Administered 2012-06-12: 500 [IU] via INTRAVENOUS
  Filled 2012-06-12: qty 5

## 2012-06-12 MED ORDER — SODIUM CHLORIDE 0.9 % IJ SOLN
10.0000 mL | INTRAMUSCULAR | Status: DC | PRN
  Administered 2012-06-12: 10 mL via INTRAVENOUS
  Filled 2012-06-12: qty 10

## 2012-06-12 NOTE — Patient Instructions (Signed)

## 2012-07-31 ENCOUNTER — Ambulatory Visit (HOSPITAL_BASED_OUTPATIENT_CLINIC_OR_DEPARTMENT_OTHER): Payer: Managed Care, Other (non HMO) | Admitting: Hematology & Oncology

## 2012-07-31 ENCOUNTER — Ambulatory Visit (HOSPITAL_BASED_OUTPATIENT_CLINIC_OR_DEPARTMENT_OTHER): Payer: Managed Care, Other (non HMO) | Admitting: Lab

## 2012-07-31 VITALS — BP 106/66 | HR 70 | Temp 97.9°F | Resp 16 | Ht 67.0 in | Wt 231.0 lb

## 2012-07-31 DIAGNOSIS — E559 Vitamin D deficiency, unspecified: Secondary | ICD-10-CM

## 2012-07-31 DIAGNOSIS — C8299 Follicular lymphoma, unspecified, extranodal and solid organ sites: Secondary | ICD-10-CM

## 2012-07-31 DIAGNOSIS — Z452 Encounter for adjustment and management of vascular access device: Secondary | ICD-10-CM

## 2012-07-31 DIAGNOSIS — C8280 Other types of follicular lymphoma, unspecified site: Secondary | ICD-10-CM

## 2012-07-31 LAB — CBC WITH DIFFERENTIAL (CANCER CENTER ONLY)
BASO#: 0.1 10*3/uL (ref 0.0–0.2)
BASO%: 0.9 % (ref 0.0–2.0)
EOS%: 1.9 % (ref 0.0–7.0)
Eosinophils Absolute: 0.1 10*3/uL (ref 0.0–0.5)
HCT: 42.6 % (ref 34.8–46.6)
HGB: 15.1 g/dL (ref 11.6–15.9)
LYMPH#: 1 10*3/uL (ref 0.9–3.3)
LYMPH%: 18.9 % (ref 14.0–48.0)
MCH: 29.5 pg (ref 26.0–34.0)
MCHC: 35.4 g/dL (ref 32.0–36.0)
MCV: 83 fL (ref 81–101)
MONO#: 0.3 10*3/uL (ref 0.1–0.9)
MONO%: 5.4 % (ref 0.0–13.0)
NEUT#: 3.9 10*3/uL (ref 1.5–6.5)
NEUT%: 72.9 % (ref 39.6–80.0)
Platelets: 155 10*3/uL (ref 145–400)
RBC: 5.11 10*6/uL (ref 3.70–5.32)
RDW: 13.4 % (ref 11.1–15.7)
WBC: 5.4 10*3/uL (ref 3.9–10.0)

## 2012-07-31 MED ORDER — HEPARIN SOD (PORK) LOCK FLUSH 100 UNIT/ML IV SOLN
500.0000 [IU] | Freq: Once | INTRAVENOUS | Status: AC
  Administered 2012-07-31: 500 [IU] via INTRAVENOUS
  Filled 2012-07-31: qty 5

## 2012-07-31 MED ORDER — SODIUM CHLORIDE 0.9 % IJ SOLN
10.0000 mL | INTRAMUSCULAR | Status: DC | PRN
  Administered 2012-07-31: 10 mL via INTRAVENOUS
  Filled 2012-07-31: qty 10

## 2012-07-31 NOTE — Progress Notes (Signed)
This office note has been dictated.

## 2012-08-01 LAB — COMPREHENSIVE METABOLIC PANEL
ALT: 13 U/L (ref 0–35)
AST: 14 U/L (ref 0–37)
Albumin: 4.5 g/dL (ref 3.5–5.2)
BUN: 17 mg/dL (ref 6–23)
CO2: 27 mEq/L (ref 19–32)
Calcium: 9.4 mg/dL (ref 8.4–10.5)
Chloride: 105 mEq/L (ref 96–112)
Creatinine, Ser: 0.78 mg/dL (ref 0.50–1.10)
Potassium: 4 mEq/L (ref 3.5–5.3)

## 2012-08-01 LAB — VITAMIN D 25 HYDROXY (VIT D DEFICIENCY, FRACTURES): Vit D, 25-Hydroxy: 37 ng/mL (ref 30–89)

## 2012-08-01 LAB — LACTATE DEHYDROGENASE: LDH: 140 U/L (ref 94–250)

## 2012-08-01 NOTE — Progress Notes (Signed)
DIAGNOSIS:  Follicular large cell non-Hodgkin lymphoma.  CURRENT THERAPY:  Observation.  INTERIM HISTORY:  Stacey Livingston comes in for followup.  She is now off all treatment.  She completed her maintenance Rituxan a year or so ago.  She still has her Port-A-Cath in.  We might be able to think about getting this taken out now.  It has been over a year.  She has had no problems with fatigue or weakness.  She is still working without any difficulties.  She has had no cough or shortness breath.  There has been no leg swelling.  There has been no change in bowel or bladder habits.  She has had no headaches.  She has not noticed any palpable lymph glands.  She does get her mammograms yearly.  I think she is due for one in December.  PHYSICAL EXAMINATION:  General:  This is a well-developed, well- nourished white female in no obvious distress.  Vital signs: Temperature of 97.9, pulse 70, respiratory rate 16, blood pressure 106/66.  Weight is 231.  Head and neck:  Normocephalic, atraumatic skull.  There are no ocular or oral lesions.  There are no palpable cervical or supraclavicular lymph nodes.  Lungs:  Clear bilaterally. Cardiac:  Regular rate and rhythm with a normal S1, S2.  There are no murmurs, rubs or bruits.  Abdomen:  Soft with good bowel sounds.  There is no palpable abdominal mass.  There is no fluid wave.  There is no palpable hepatosplenomegaly.  Extremities:  Show no clubbing, cyanosis or edema.  Neurological:  Shows no focal neurological deficits.  LABORATORY STUDIES:  White cell count is 5.4, 15.1, hematocrit 42.6, platelet count 155.  IMPRESSION:  Stacey Livingston is a very charming 47 year old white female with history of follicular large cell lymphoma.  She completed 6 cycles of Treanda/Rituxan followed by 2 years of maintenance Rituxan.  Again, she finished everything back in May 2013.  I will talk to her about getting her Port-A-Cath taken out.  I really think that we  are the point where we could consider doing this.  We will plan to get her back now in 6 months.   ______________________________ Josph Macho, M.D. PRE/MEDQ  D:  07/31/2012  T:  08/01/2012  Job:  [id number]

## 2012-09-29 ENCOUNTER — Ambulatory Visit (HOSPITAL_BASED_OUTPATIENT_CLINIC_OR_DEPARTMENT_OTHER): Payer: Managed Care, Other (non HMO)

## 2012-09-29 VITALS — BP 112/73 | HR 55 | Temp 96.7°F

## 2012-09-29 DIAGNOSIS — Z452 Encounter for adjustment and management of vascular access device: Secondary | ICD-10-CM

## 2012-09-29 DIAGNOSIS — C8299 Follicular lymphoma, unspecified, extranodal and solid organ sites: Secondary | ICD-10-CM

## 2012-09-29 DIAGNOSIS — C8280 Other types of follicular lymphoma, unspecified site: Secondary | ICD-10-CM

## 2012-09-29 MED ORDER — SODIUM CHLORIDE 0.9 % IJ SOLN
10.0000 mL | INTRAMUSCULAR | Status: DC | PRN
  Administered 2012-09-29: 10 mL via INTRAVENOUS
  Filled 2012-09-29: qty 10

## 2012-09-29 MED ORDER — ALTEPLASE 2 MG IJ SOLR
2.0000 mg | Freq: Once | INTRAMUSCULAR | Status: DC | PRN
  Filled 2012-09-29: qty 2

## 2012-09-29 MED ORDER — HEPARIN SOD (PORK) LOCK FLUSH 100 UNIT/ML IV SOLN
500.0000 [IU] | Freq: Once | INTRAVENOUS | Status: AC | PRN
  Administered 2012-09-29: 500 [IU] via INTRAVENOUS
  Filled 2012-09-29: qty 5

## 2012-09-29 NOTE — Patient Instructions (Signed)
Implanted Port Instructions  An implanted port is a central line that has a round shape and is placed under the skin. It is used for long-term IV (intravenous) access for:  · Medicine.  · Fluids.  · Liquid nutrition, such as TPN (total parenteral nutrition).  · Blood samples.  Ports can be placed:  · In the chest area just below the collarbone (this is the most common place.)  · In the arms.  · In the belly (abdomen) area.  · In the legs.  PARTS OF THE PORT  A port has 2 main parts:  · The reservoir. The reservoir is round, disc-shaped, and will be a small, raised area under your skin.  · The reservoir is the part where a needle is inserted (accessed) to either give medicines or to draw blood.  · The catheter. The catheter is a long, slender tube that extends from the reservoir. The catheter is placed into a large vein.  · Medicine that is inserted into the reservoir goes into the catheter and then into the vein.  INSERTION OF THE PORT  · The port is surgically placed in either an operating room or in a procedural area (interventional radiology).  · Medicine may be given to help you relax during the procedure.  · The skin where the port will be inserted is numbed (local anesthetic).  · 1 or 2 small cuts (incisions) will be made in the skin to insert the port.  · The port can be used after it has been inserted.  INCISION SITE CARE  · The incision site may have small adhesive strips on it. This helps keep the incision site closed. Sometimes, no adhesive strips are placed. Instead of adhesive strips, a special kind of surgical glue is used to keep the incision closed.  · If adhesive strips were placed on the incision sites, do not take them off. They will fall off on their own.  · The incision site may be sore for 1 to 2 days. Pain medicine can help.  · Do not get the incision site wet. Bathe or shower as directed by your caregiver.  · The incision site should heal in 5 to 7 days. A small scar may form after the  incision has healed.  ACCESSING THE PORT  Special steps must be taken to access the port:  · Before the port is accessed, a numbing cream can be placed on the skin. This helps numb the skin over the port site.  · A sterile technique is used to access the port.  · The port is accessed with a needle. Only "non-coring" port needles should be used to access the port. Once the port is accessed, a blood return should be checked. This helps ensure the port is in the vein and is not clogged (clotted).  · If your caregiver believes your port should remain accessed, a clear (transparent) bandage will be placed over the needle site. The bandage and needle will need to be changed every week or as directed by your caregiver.  · Keep the bandage covering the needle clean and dry. Do not get it wet. Follow your caregiver's instructions on how to take a shower or bath when the port is accessed.  · If your port does not need to stay accessed, no bandage is needed over the port.  FLUSHING THE PORT  Flushing the port keeps it from getting clogged. How often the port is flushed depends on:  · If a   constant infusion is running. If a constant infusion is running, the port may not need to be flushed.  · If intermittent medicines are given.  · If the port is not being used.  For intermittent medicines:  · The port will need to be flushed:  · After medicines have been given.  · After blood has been drawn.  · As part of routine maintenance.  · A port is normally flushed with:  · Normal saline.  · Heparin.  · Follow your caregiver's advice on how often, how much, and the type of flush to use on your port.  IMPORTANT PORT INFORMATION  · Tell your caregiver if you are allergic to heparin.  · After your port is placed, you will get a manufacturer's information card. The card has information about your port. Keep this card with you at all times.  · There are many types of ports available. Know what kind of port you have.  · In case of an  emergency, it may be helpful to wear a medical alert bracelet. This can help alert health care workers that you have a port.  · The port can stay in for as long as your caregiver believes it is necessary.  · When it is time for the port to come out, surgery will be done to remove it. The surgery will be similar to how the port was put in.  · If you are in the hospital or clinic:  · Your port will be taken care of and flushed by a nurse.  · If you are at home:  · A home health care nurse may give medicines and take care of the port.  · You or a family member can get special training and directions for giving medicine and taking care of the port at home.  SEEK IMMEDIATE MEDICAL CARE IF:   · Your port does not flush or you are unable to get a blood return.  · New drainage or pus is coming from the incision.  · A bad smell is coming from the incision site.  · You develop swelling or increased redness at the incision site.  · You develop increased swelling or pain at the port site.  · You develop swelling or pain in the surrounding skin near the port.  · You have an oral temperature above 102° F (38.9° C), not controlled by medicine.  MAKE SURE YOU:   · Understand these instructions.  · Will watch your condition.  · Will get help right away if you are not doing well or get worse.  Document Released: 01/11/2005 Document Revised: 04/05/2011 Document Reviewed: 04/04/2008  [employer]® Patient Information ©2014 [employer].

## 2012-11-20 ENCOUNTER — Ambulatory Visit (HOSPITAL_BASED_OUTPATIENT_CLINIC_OR_DEPARTMENT_OTHER): Payer: Managed Care, Other (non HMO)

## 2012-11-20 VITALS — BP 122/68 | HR 65 | Temp 97.6°F

## 2012-11-20 DIAGNOSIS — C8299 Follicular lymphoma, unspecified, extranodal and solid organ sites: Secondary | ICD-10-CM

## 2012-11-20 DIAGNOSIS — Z452 Encounter for adjustment and management of vascular access device: Secondary | ICD-10-CM

## 2012-11-20 DIAGNOSIS — C8280 Other types of follicular lymphoma, unspecified site: Secondary | ICD-10-CM

## 2012-11-20 MED ORDER — SODIUM CHLORIDE 0.9 % IJ SOLN
10.0000 mL | INTRAMUSCULAR | Status: DC | PRN
  Administered 2012-11-20: 10 mL via INTRAVENOUS
  Filled 2012-11-20: qty 10

## 2012-11-20 MED ORDER — HEPARIN SOD (PORK) LOCK FLUSH 100 UNIT/ML IV SOLN
500.0000 [IU] | Freq: Once | INTRAVENOUS | Status: AC | PRN
  Administered 2012-11-20: 500 [IU] via INTRAVENOUS
  Filled 2012-11-20: qty 5

## 2013-01-08 ENCOUNTER — Other Ambulatory Visit (HOSPITAL_BASED_OUTPATIENT_CLINIC_OR_DEPARTMENT_OTHER): Payer: Managed Care, Other (non HMO) | Admitting: Lab

## 2013-01-08 ENCOUNTER — Ambulatory Visit (HOSPITAL_BASED_OUTPATIENT_CLINIC_OR_DEPARTMENT_OTHER): Payer: Managed Care, Other (non HMO) | Admitting: Hematology & Oncology

## 2013-01-08 ENCOUNTER — Ambulatory Visit (HOSPITAL_BASED_OUTPATIENT_CLINIC_OR_DEPARTMENT_OTHER): Payer: Managed Care, Other (non HMO)

## 2013-01-08 ENCOUNTER — Telehealth (INDEPENDENT_AMBULATORY_CARE_PROVIDER_SITE_OTHER): Payer: Self-pay

## 2013-01-08 VITALS — BP 122/69 | HR 70 | Temp 98.1°F | Resp 14 | Ht 67.0 in | Wt 232.0 lb

## 2013-01-08 DIAGNOSIS — Z87898 Personal history of other specified conditions: Secondary | ICD-10-CM

## 2013-01-08 DIAGNOSIS — C8280 Other types of follicular lymphoma, unspecified site: Secondary | ICD-10-CM

## 2013-01-08 LAB — CBC WITH DIFFERENTIAL (CANCER CENTER ONLY)
BASO#: 0 10*3/uL (ref 0.0–0.2)
BASO%: 0.4 % (ref 0.0–2.0)
EOS%: 1.7 % (ref 0.0–7.0)
HGB: 14.1 g/dL (ref 11.6–15.9)
LYMPH#: 1 10*3/uL (ref 0.9–3.3)
MCH: 28.7 pg (ref 26.0–34.0)
MCHC: 34.8 g/dL (ref 32.0–36.0)
MONO%: 4.7 % (ref 0.0–13.0)
NEUT#: 6.2 10*3/uL (ref 1.5–6.5)
NEUT%: 80.8 % — ABNORMAL HIGH (ref 39.6–80.0)
RDW: 13.4 % (ref 11.1–15.7)

## 2013-01-08 LAB — COMPREHENSIVE METABOLIC PANEL
ALT: 17 U/L (ref 0–35)
AST: 13 U/L (ref 0–37)
Alkaline Phosphatase: 76 U/L (ref 39–117)
CO2: 29 mEq/L (ref 19–32)
Calcium: 9.3 mg/dL (ref 8.4–10.5)
Chloride: 101 mEq/L (ref 96–112)
Creatinine, Ser: 0.72 mg/dL (ref 0.50–1.10)
Sodium: 139 mEq/L (ref 135–145)

## 2013-01-08 LAB — LACTATE DEHYDROGENASE: LDH: 164 U/L (ref 94–250)

## 2013-01-08 MED ORDER — HEPARIN SOD (PORK) LOCK FLUSH 100 UNIT/ML IV SOLN
500.0000 [IU] | Freq: Once | INTRAVENOUS | Status: AC | PRN
  Administered 2013-01-08: 500 [IU] via INTRAVENOUS
  Filled 2013-01-08: qty 5

## 2013-01-08 MED ORDER — SODIUM CHLORIDE 0.9 % IJ SOLN
10.0000 mL | INTRAMUSCULAR | Status: DC | PRN
  Administered 2013-01-08: 10 mL via INTRAVENOUS
  Filled 2013-01-08: qty 10

## 2013-01-08 NOTE — Telephone Encounter (Signed)
Dr. Myna Hidalgo called and would like for patient to have PAC Removed.  Will forward message to Dr. Derrell Lolling and his RN Hunt[place] Dec)

## 2013-01-08 NOTE — Progress Notes (Signed)
This office note has been dictated.

## 2013-01-08 NOTE — Telephone Encounter (Signed)
OK.  Please schedule.  hmi

## 2013-01-08 NOTE — Patient Instructions (Signed)
Implanted Port Instructions  An implanted port is a central line that has a round shape and is placed under the skin. It is used for long-term IV (intravenous) access for:  · Medicine.  · Fluids.  · Liquid nutrition, such as TPN (total parenteral nutrition).  · Blood samples.  Ports can be placed:  · In the chest area just below the collarbone (this is the most common place.)  · In the arms.  · In the belly (abdomen) area.  · In the legs.  PARTS OF THE PORT  A port has 2 main parts:  · The reservoir. The reservoir is round, disc-shaped, and will be a small, raised area under your skin.  · The reservoir is the part where a needle is inserted (accessed) to either give medicines or to draw blood.  · The catheter. The catheter is a long, slender tube that extends from the reservoir. The catheter is placed into a large vein.  · Medicine that is inserted into the reservoir goes into the catheter and then into the vein.  INSERTION OF THE PORT  · The port is surgically placed in either an operating room or in a procedural area (interventional radiology).  · Medicine may be given to help you relax during the procedure.  · The skin where the port will be inserted is numbed (local anesthetic).  · 1 or 2 small cuts (incisions) will be made in the skin to insert the port.  · The port can be used after it has been inserted.  INCISION SITE CARE  · The incision site may have small adhesive strips on it. This helps keep the incision site closed. Sometimes, no adhesive strips are placed. Instead of adhesive strips, a special kind of surgical glue is used to keep the incision closed.  · If adhesive strips were placed on the incision sites, do not take them off. They will fall off on their own.  · The incision site may be sore for 1 to 2 days. Pain medicine can help.  · Do not get the incision site wet. Bathe or shower as directed by your caregiver.  · The incision site should heal in 5 to 7 days. A small scar may form after the  incision has healed.  ACCESSING THE PORT  Special steps must be taken to access the port:  · Before the port is accessed, a numbing cream can be placed on the skin. This helps numb the skin over the port site.  · A sterile technique is used to access the port.  · The port is accessed with a needle. Only "non-coring" port needles should be used to access the port. Once the port is accessed, a blood return should be checked. This helps ensure the port is in the vein and is not clogged (clotted).  · If your caregiver believes your port should remain accessed, a clear (transparent) bandage will be placed over the needle site. The bandage and needle will need to be changed every week or as directed by your caregiver.  · Keep the bandage covering the needle clean and dry. Do not get it wet. Follow your caregiver's instructions on how to take a shower or bath when the port is accessed.  · If your port does not need to stay accessed, no bandage is needed over the port.  FLUSHING THE PORT  Flushing the port keeps it from getting clogged. How often the port is flushed depends on:  · If a   constant infusion is running. If a constant infusion is running, the port may not need to be flushed.  · If intermittent medicines are given.  · If the port is not being used.  For intermittent medicines:  · The port will need to be flushed:  · After medicines have been given.  · After blood has been drawn.  · As part of routine maintenance.  · A port is normally flushed with:  · Normal saline.  · Heparin.  · Follow your caregiver's advice on how often, how much, and the type of flush to use on your port.  IMPORTANT PORT INFORMATION  · Tell your caregiver if you are allergic to heparin.  · After your port is placed, you will get a manufacturer's information card. The card has information about your port. Keep this card with you at all times.  · There are many types of ports available. Know what kind of port you have.  · In case of an  emergency, it may be helpful to wear a medical alert bracelet. This can help alert health care workers that you have a port.  · The port can stay in for as long as your caregiver believes it is necessary.  · When it is time for the port to come out, surgery will be done to remove it. The surgery will be similar to how the port was put in.  · If you are in the hospital or clinic:  · Your port will be taken care of and flushed by a nurse.  · If you are at home:  · A home health care nurse may give medicines and take care of the port.  · You or a family member can get special training and directions for giving medicine and taking care of the port at home.  SEEK IMMEDIATE MEDICAL CARE IF:   · Your port does not flush or you are unable to get a blood return.  · New drainage or pus is coming from the incision.  · A bad smell is coming from the incision site.  · You develop swelling or increased redness at the incision site.  · You develop increased swelling or pain at the port site.  · You develop swelling or pain in the surrounding skin near the port.  · You have an oral temperature above 102° F (38.9° C), not controlled by medicine.  MAKE SURE YOU:   · Understand these instructions.  · Will watch your condition.  · Will get help right away if you are not doing well or get worse.  Document Released: 01/11/2005 Document Revised: 04/05/2011 Document Reviewed: 04/04/2008  [employer]® Patient Information ©2014 [employer].

## 2013-01-08 NOTE — Progress Notes (Signed)
Stacey Livingston presented for Portacath access and flush. Proper placement of portacath confirmed by CXR. Portacath located in the R chest wall accessed with  20G huber needle. Site WNL, with good blood return.  Portacath flushed with 20ml NS and 500U/82ml Heparin per protocol and needle removed intact. Procedure without incident. Patient tolerated procedure well.

## 2013-01-09 ENCOUNTER — Other Ambulatory Visit (INDEPENDENT_AMBULATORY_CARE_PROVIDER_SITE_OTHER): Payer: Self-pay | Admitting: General Surgery

## 2013-01-09 NOTE — Telephone Encounter (Signed)
The schedulers will need posting sheet and orders.

## 2013-01-09 NOTE — Progress Notes (Signed)
DIAGNOSIS:  Follicular large cell non-Hodgkin lymphoma.  CURRENT THERAPY:  Observation.  INTERIM HISTORY:  Ms. Creedon comes in for followup.  She continues to do quite well.  She has had no complaints since we last saw her.  She is working.  She is having no problems with fever, sweats, or chills. There has been no change in bowel or bladder habits.  Her monthly cycles are becoming more irregular.  She says that her mom and grandmother went through the change of life in their mid 61s.  She is taking vitamin D.  She is doing roller derby.  She is doing well with this.  She really enjoys this.  Her, I think, mammogram is supposedly due sometime this month.  PHYSICAL EXAMINATION:  General:  This is a well-developed, well- nourished white female, in no obvious distress.  Vital Signs: Temperature of 98.1, pulse 70, respiratory rate 14, blood pressure 122/69.  Weight is 232 pounds.  Head and Neck:  Normocephalic, atraumatic skull.  She has no ocular or oral lesions.  There is no adenopathy in the neck.  Lungs:  Clear bilaterally.  Cardiac:  Regular rate and rhythm with a normal S1, S2.  There are no murmurs, rubs, or bruits.  Abdomen:  Soft.  She has good bowel sounds.  There is no fluid wave.  There is no palpable hepatosplenomegaly.  Axillary:  No bilateral axillary adenopathy.  Back:  No tenderness over the spine, ribs, or hips.  Extremities:  No clubbing, cyanosis, or edema.  Skin:  No rashes, ecchymoses, or petechiae.  LABORATORY STUDIES:  White cell count is 7.7, hemoglobin 14.1, hematocrit 40.5, platelet count 156.  IMPRESSION:  Ms. Lore is a very charming 47 year old white female with history of follicular large-cell non-Hodgkin lymphoma.  We treated her with Rituxan/Treanda.  She received 6 cycles of this.  We then put on maintenance Rituxan.  She completed this in May 2013.  I think we can go ahead and get her Port-A-Cath taken out now.  I do not see that we really need  it.  I really believe that she has a good chance of long-term remission.  We will have Dr. Jacinto Halim office take out her Port-A-Cath.  I called his office to get this set up.  I will plan to see her back in 6 months' time.   ______________________________ Josph Macho, M.D. PRE/MEDQ  D:  01/08/2013  T:  01/09/2013  Job:  [id number]

## 2013-01-22 ENCOUNTER — Encounter (HOSPITAL_BASED_OUTPATIENT_CLINIC_OR_DEPARTMENT_OTHER): Payer: Self-pay | Admitting: *Deleted

## 2013-01-23 NOTE — H&P (Signed)
  History: This patient has been followed for some time by Dr. Arlan Organ. She has been treated for follicular large cell non-Hodgkin's lymphoma. Port-A-Cath was placed sometime in the past. She has been stable and is on maintenance Rituxan. Dr. Myna Hidalgo has referred her back for Port-A-Cath removal. He thinks he has a good chance for long-term remission.  Exam: Well-developed well-nourished white female no distress. Head and neck. No oral lesions. No cervical adenopathy Lungs clear to auscultation bilaterally heart regular rate and rhythm no murmurs no ectopy Abdomen soft. Good bowel sounds. No fluid. Liver and spleen not palpable Axillary no adenopathy bilaterally  Assessment: Follicular large cell non-Hodgkin's lymphoma. On maintenance Rituxan. The chance of long-term remission  Plan: Scheduled for removal of Port-A-Cath.  Angelia Mould. Derrell Lolling, M.D., [facility] General and Minimally invasive Surgery Breast and Colorectal Surgery Office:   [phone] Pager:   [phone]

## 2013-01-24 ENCOUNTER — Ambulatory Visit (HOSPITAL_BASED_OUTPATIENT_CLINIC_OR_DEPARTMENT_OTHER): Payer: Managed Care, Other (non HMO) | Admitting: Anesthesiology

## 2013-01-24 ENCOUNTER — Ambulatory Visit (HOSPITAL_BASED_OUTPATIENT_CLINIC_OR_DEPARTMENT_OTHER)
Admission: RE | Admit: 2013-01-24 | Discharge: 2013-01-24 | Disposition: A | Discharge status: Home / Self Care | Payer: Managed Care, Other (non HMO) | Source: Ambulatory Visit | Attending: General Surgery | Admitting: General Surgery

## 2013-01-24 ENCOUNTER — Encounter (HOSPITAL_BASED_OUTPATIENT_CLINIC_OR_DEPARTMENT_OTHER): Payer: Managed Care, Other (non HMO) | Admitting: Anesthesiology

## 2013-01-24 ENCOUNTER — Encounter (HOSPITAL_BASED_OUTPATIENT_CLINIC_OR_DEPARTMENT_OTHER): Payer: Self-pay | Admitting: *Deleted

## 2013-01-24 ENCOUNTER — Encounter (HOSPITAL_BASED_OUTPATIENT_CLINIC_OR_DEPARTMENT_OTHER)
Admission: RE | Disposition: A | Discharge status: Home / Self Care | Payer: Self-pay | Source: Ambulatory Visit | Attending: General Surgery

## 2013-01-24 DIAGNOSIS — C8280 Other types of follicular lymphoma, unspecified site: Secondary | ICD-10-CM | POA: Diagnosis present

## 2013-01-24 DIAGNOSIS — Z452 Encounter for adjustment and management of vascular access device: Secondary | ICD-10-CM

## 2013-01-24 DIAGNOSIS — C8589 Other specified types of non-Hodgkin lymphoma, extranodal and solid organ sites: Secondary | ICD-10-CM | POA: Insufficient documentation

## 2013-01-24 HISTORY — PX: PORT-A-CATH REMOVAL: SHX5289

## 2013-01-24 HISTORY — DX: Other specified health status: Z78.9

## 2013-01-24 LAB — POCT HEMOGLOBIN-HEMACUE: Hemoglobin: 14 g/dL (ref 12.0–15.0)

## 2013-01-24 SURGERY — REMOVAL PORT-A-CATH
Anesthesia: Monitor Anesthesia Care | Site: Chest | Laterality: Right

## 2013-01-24 MED ORDER — SODIUM BICARBONATE 4 % IV SOLN
INTRAVENOUS | Status: AC
  Filled 2013-01-24: qty 5

## 2013-01-24 MED ORDER — PROPOFOL INFUSION 10 MG/ML OPTIME
INTRAVENOUS | Status: DC | PRN
  Administered 2013-01-24: 75 ug/kg/min via INTRAVENOUS

## 2013-01-24 MED ORDER — FENTANYL CITRATE 0.05 MG/ML IJ SOLN: 25.0000 ug | INTRAMUSCULAR | Status: DC | PRN

## 2013-01-24 MED ORDER — SODIUM CHLORIDE 0.9 % IJ SOLN: 3.0000 mL | INTRAMUSCULAR | Status: DC | PRN

## 2013-01-24 MED ORDER — ONDANSETRON HCL 4 MG/2ML IJ SOLN
INTRAMUSCULAR | Status: DC | PRN
  Administered 2013-01-24: 4 mg via INTRAVENOUS

## 2013-01-24 MED ORDER — SODIUM CHLORIDE 0.9 % IJ SOLN: 3.0000 mL | Freq: Two times a day (BID) | INTRAMUSCULAR | Status: DC

## 2013-01-24 MED ORDER — MIDAZOLAM HCL 2 MG/2ML IJ SOLN
INTRAMUSCULAR | Status: AC
  Filled 2013-01-24: qty 2

## 2013-01-24 MED ORDER — MIDAZOLAM HCL 5 MG/5ML IJ SOLN
INTRAMUSCULAR | Status: DC | PRN
  Administered 2013-01-24: 2 mg via INTRAVENOUS

## 2013-01-24 MED ORDER — FENTANYL CITRATE 0.05 MG/ML IJ SOLN
INTRAMUSCULAR | Status: DC | PRN
  Administered 2013-01-24: 100 ug via INTRAVENOUS

## 2013-01-24 MED ORDER — LACTATED RINGERS IV SOLN
INTRAVENOUS | Status: DC
  Administered 2013-01-24 (×2): via INTRAVENOUS

## 2013-01-24 MED ORDER — OXYCODONE HCL 5 MG/5ML PO SOLN: 5.0000 mg | Freq: Once | ORAL | Status: DC | PRN

## 2013-01-24 MED ORDER — ACETAMINOPHEN 325 MG PO TABS: 650.0000 mg | ORAL_TABLET | ORAL | Status: DC | PRN

## 2013-01-24 MED ORDER — ACETAMINOPHEN 650 MG RE SUPP: 650.0000 mg | RECTAL | Status: DC | PRN

## 2013-01-24 MED ORDER — MIDAZOLAM HCL 2 MG/2ML IJ SOLN: 1.0000 mg | INTRAMUSCULAR | Status: DC | PRN

## 2013-01-24 MED ORDER — OXYCODONE HCL 5 MG PO TABS: 5.0000 mg | ORAL_TABLET | Freq: Once | ORAL | Status: DC | PRN

## 2013-01-24 MED ORDER — HYDROCODONE-ACETAMINOPHEN 5-325 MG PO TABS: 1.0000 | ORAL_TABLET | ORAL | Status: DC | PRN

## 2013-01-24 MED ORDER — FENTANYL CITRATE 0.05 MG/ML IJ SOLN
INTRAMUSCULAR | Status: AC
  Filled 2013-01-24: qty 2

## 2013-01-24 MED ORDER — HYDROMORPHONE HCL PF 1 MG/ML IJ SOLN: 0.2500 mg | INTRAMUSCULAR | Status: DC | PRN

## 2013-01-24 MED ORDER — SODIUM CHLORIDE 0.9 % IV SOLN: 250.0000 mL | INTRAVENOUS | Status: DC | PRN

## 2013-01-24 MED ORDER — FENTANYL CITRATE 0.05 MG/ML IJ SOLN: 50.0000 ug | INTRAMUSCULAR | Status: DC | PRN

## 2013-01-24 MED ORDER — ONDANSETRON HCL 4 MG/2ML IJ SOLN: 4.0000 mg | Freq: Once | INTRAMUSCULAR | Status: DC | PRN

## 2013-01-24 MED ORDER — LIDOCAINE HCL (CARDIAC) 20 MG/ML IV SOLN
INTRAVENOUS | Status: DC | PRN
  Administered 2013-01-24: 50 mg via INTRAVENOUS

## 2013-01-24 MED ORDER — SODIUM BICARBONATE 4 % IV SOLN
INTRAVENOUS | Status: DC | PRN
  Administered 2013-01-24: 5 mL via INTRAVENOUS

## 2013-01-24 MED ORDER — PROPOFOL 10 MG/ML IV EMUL
INTRAVENOUS | Status: AC
  Filled 2013-01-24: qty 50

## 2013-01-24 MED ORDER — LIDOCAINE 1 % OPTIME INJ - NO CHARGE
INTRAMUSCULAR | Status: DC | PRN
  Administered 2013-01-24: 30 mL

## 2013-01-24 MED ORDER — LIDOCAINE-EPINEPHRINE (PF) 1 %-1:200000 IJ SOLN
INTRAMUSCULAR | Status: AC
  Filled 2013-01-24: qty 10

## 2013-01-24 MED ORDER — BACITRACIN ZINC 500 UNIT/GM EX OINT
TOPICAL_OINTMENT | CUTANEOUS | Status: AC
  Filled 2013-01-24: qty 28.35

## 2013-01-24 MED ORDER — OXYCODONE HCL 5 MG PO TABS: 5.0000 mg | ORAL_TABLET | ORAL | Status: DC | PRN

## 2013-01-24 MED ORDER — ONDANSETRON HCL 4 MG/2ML IJ SOLN: 4.0000 mg | Freq: Four times a day (QID) | INTRAMUSCULAR | Status: DC | PRN

## 2013-01-24 SURGICAL SUPPLY — 36 items
ADH SKN CLS APL DERMABOND .7 (GAUZE/BANDAGES/DRESSINGS) ×1
APL SKNCLS STERI-STRIP NONHPOA (GAUZE/BANDAGES/DRESSINGS)
BENZOIN TINCTURE PRP APPL 2/3 (GAUZE/BANDAGES/DRESSINGS) IMPLANT
BLADE HEX COATED 2.75 (ELECTRODE) ×2 IMPLANT
BLADE SURG 15 STRL LF DISP TIS (BLADE) ×2 IMPLANT
BLADE SURG 15 STRL SS (BLADE) ×4
CANISTER SUCT 1200ML W/VALVE (MISCELLANEOUS) IMPLANT
CHLORAPREP W/TINT 26ML (MISCELLANEOUS) ×2 IMPLANT
COVER MAYO STAND STRL (DRAPES) ×2 IMPLANT
COVER TABLE BACK 60X90 (DRAPES) ×2 IMPLANT
DECANTER SPIKE VIAL GLASS SM (MISCELLANEOUS) ×2 IMPLANT
DERMABOND ADVANCED (GAUZE/BANDAGES/DRESSINGS) ×1
DERMABOND ADVANCED .7 DNX12 (GAUZE/BANDAGES/DRESSINGS) IMPLANT
DRAPE PED LAPAROTOMY (DRAPES) ×2 IMPLANT
DRAPE UTILITY XL STRL (DRAPES) ×2 IMPLANT
DRSG TEGADERM 4X4.75 (GAUZE/BANDAGES/DRESSINGS) IMPLANT
ELECT REM PT RETURN 9FT ADLT (ELECTROSURGICAL) ×2
ELECTRODE REM PT RTRN 9FT ADLT (ELECTROSURGICAL) ×1 IMPLANT
GLOVE EUDERMIC 7 POWDERFREE (GLOVE) ×2 IMPLANT
GLOVE SURG SS PI 7.0 STRL IVOR (GLOVE) ×1 IMPLANT
GOWN PREVENTION PLUS XLARGE (GOWN DISPOSABLE) ×1 IMPLANT
GOWN PREVENTION PLUS XXLARGE (GOWN DISPOSABLE) ×3 IMPLANT
NDL HYPO 25X1 1.5 SAFETY (NEEDLE) ×1 IMPLANT
NEEDLE HYPO 25X1 1.5 SAFETY (NEEDLE) ×2 IMPLANT
PACK BASIN DAY SURGERY FS (CUSTOM PROCEDURE TRAY) ×2 IMPLANT
PENCIL BUTTON HOLSTER BLD 10FT (ELECTRODE) ×2 IMPLANT
SLEEVE SCD COMPRESS KNEE MED (MISCELLANEOUS) ×1 IMPLANT
SPONGE GAUZE 4X4 12PLY STER LF (GAUZE/BANDAGES/DRESSINGS) IMPLANT
STRIP CLOSURE SKIN 1/2X4 (GAUZE/BANDAGES/DRESSINGS) IMPLANT
SUT MNCRL AB 4-0 PS2 18 (SUTURE) ×2 IMPLANT
SUT VICRYL 3-0 CR8 SH (SUTURE) ×2 IMPLANT
SYR CONTROL 10ML LL (SYRINGE) ×2 IMPLANT
TOWEL OR 17X24 6PK STRL BLUE (TOWEL DISPOSABLE) ×4 IMPLANT
TOWEL OR NON WOVEN STRL DISP B (DISPOSABLE) ×2 IMPLANT
TUBE CONNECTING 20X1/4 (TUBING) IMPLANT
YANKAUER SUCT BULB TIP NO VENT (SUCTIONS) IMPLANT

## 2013-01-24 NOTE — Anesthesia Postprocedure Evaluation (Signed)
  Anesthesia Post-op Note  Patient: Stacey Livingston  Procedure(s) Performed: Procedure(s): REMOVAL PORT-A-CATH (Right)  Patient Location: PACU  Anesthesia Type:MAC  Level of Consciousness: awake  Airway and Oxygen Therapy: Patient Spontanous Breathing  Post-op Pain: mild  Post-op Assessment: Post-op Vital signs reviewed  Post-op Vital Signs: Reviewed  Complications: No apparent anesthesia complications

## 2013-01-24 NOTE — Anesthesia Preprocedure Evaluation (Signed)
Anesthesia Evaluation  Patient identified by MRN, date of birth, ID band Patient awake    Reviewed: Allergy & Precautions, H&P , NPO status , Patient's Chart, lab work & pertinent test results  Airway Mallampati: I TM Distance: >3 FB Neck ROM: Full    Dental  (+) Teeth Intact and Dental Advisory Given   Pulmonary  breath sounds clear to auscultation        Cardiovascular Rhythm:Regular Rate:Normal     Neuro/Psych    GI/Hepatic   Endo/Other    Renal/GU      Musculoskeletal   Abdominal   Peds  Hematology   Anesthesia Other Findings   Reproductive/Obstetrics                           Anesthesia Physical Anesthesia Plan  ASA: I  Anesthesia Plan: MAC   Post-op Pain Management:    Induction: Intravenous  Airway Management Planned: Simple Face Mask  Additional Equipment:   Intra-op Plan:   Post-operative Plan:   Informed Consent: I have reviewed the patients History and Physical, chart, labs and discussed the procedure including the risks, benefits and alternatives for the proposed anesthesia with the patient or authorized representative who has indicated his/her understanding and acceptance.   Dental advisory given  Plan Discussed with: CRNA, Anesthesiologist and Surgeon  Anesthesia Plan Comments:        Anesthesia Quick Evaluation  

## 2013-01-24 NOTE — Anesthesia Procedure Notes (Signed)
Procedure Name: MAC Date/Time: 01/24/2013 12:40 PM Performed by: Zenia Resides D Pre-anesthesia Checklist: Patient identified, Timeout performed, Emergency Drugs available, Suction available and Patient being monitored Oxygen Delivery Method: Simple face mask Preoxygenation: Pre-oxygenation with 100% oxygen

## 2013-01-24 NOTE — Op Note (Signed)
Patient Name:           Stacey Livingston   Date of Surgery:        01/24/2013  Pre op Diagnosis:      Follicular large cell non-Hodgkin's lymphoma  Post op Diagnosis:    Same  Procedure:                 Removal of Port-A-Cath  Surgeon:                     Angelia Mould. Derrell Lolling, M.D., FACS  Assistant:                      None  Operative Indications:   This patient has been followed for some time by Dr. Arlan Organ. She has been treated for follicular large cell non-Hodgkin's lymphoma. Port-A-Cath was placed 2010 and has worked well.. She has been stable and is on maintenance Rituxan. Dr. Myna Hidalgo has referred her back for Port-A-Cath removal. He thinks he has a good chance for long-term remission.   Operative Findings:       The port and catheter were removed intact. There was no bleeding and there was no evidence of infection.  Procedure in Detail:          The patient was brought to operating room #1 at [facility]. She was monitored and sedated by the anesthesia Department. The right anterior chest and lower neck were prepped and draped in a sterile fashion. Surgical time out was performed. 1% Xylocaine with epinephrine was used as local infiltration anesthetic. A transverse incision was made in the right infraclavicular area through the previous scar. Dissection was carried down to the port. The capsule was incised around the port. The port was elevated and the 3 Prolene sutures were cut and removed. The port and the catheter then were removed without any resistance. There was no bleeding. The subcutaneous tissue was closed with 3-0 Vicryl sutures the skin closed with a running subcuticular suture of 4-0 Monocryl and Dermabond. Patient tolerated procedure well and was taken to PACU in stable condition. EBL 5 cc or less. Counts correct. Complications none.     Angelia Mould. Derrell Lolling, M.D., FACS General and Minimally Invasive Surgery Breast and Colorectal Surgery  01/24/2013 1:07 PM

## 2013-01-24 NOTE — Interval H&P Note (Signed)
History and Physical Interval Note:  01/24/2013 12:34 PM  Stacey Livingston  has presented today for surgery, with the diagnosis of lymphoma  The goals and the various methods of treatment have been discussed with the patient and family. After consideration of risks, benefits and other options for treatment, the patient has consented to  Procedure(s): REMOVAL PORT-A-CATH (N/A) as a surgical intervention .  The patient's history has been reviewed, patient examined today, no change in status, stable for surgery.  I have reviewed the patient's chart and labs.  Questions were answered to the patient's satisfaction.     Ernestene Mention

## 2013-01-24 NOTE — Transfer of Care (Signed)
Immediate Anesthesia Transfer of Care Note  Patient: Stacey Livingston  Procedure(s) Performed: Procedure(s): REMOVAL PORT-A-CATH (Right)  Patient Location: PACU  Anesthesia Type:MAC  Level of Consciousness: awake, alert  and oriented  Airway & Oxygen Therapy: Patient Spontanous Breathing and Patient connected to face mask oxygen  Post-op Assessment: Report given to PACU RN and Post -op Vital signs reviewed and stable  Post vital signs: Reviewed and stable  Complications: No apparent anesthesia complications

## 2013-01-26 ENCOUNTER — Encounter (HOSPITAL_BASED_OUTPATIENT_CLINIC_OR_DEPARTMENT_OTHER): Payer: Self-pay | Admitting: General Surgery

## 2013-07-09 ENCOUNTER — Other Ambulatory Visit (HOSPITAL_BASED_OUTPATIENT_CLINIC_OR_DEPARTMENT_OTHER): Payer: Managed Care, Other (non HMO) | Admitting: Lab

## 2013-07-09 ENCOUNTER — Ambulatory Visit (HOSPITAL_BASED_OUTPATIENT_CLINIC_OR_DEPARTMENT_OTHER): Payer: Managed Care, Other (non HMO) | Admitting: Hematology & Oncology

## 2013-07-09 ENCOUNTER — Encounter: Payer: Self-pay | Admitting: Hematology & Oncology

## 2013-07-09 VITALS — BP 114/77 | HR 68 | Temp 97.0°F | Resp 14 | Ht 67.0 in | Wt 246.0 lb

## 2013-07-09 DIAGNOSIS — C8280 Other types of follicular lymphoma, unspecified site: Secondary | ICD-10-CM

## 2013-07-09 DIAGNOSIS — E559 Vitamin D deficiency, unspecified: Secondary | ICD-10-CM

## 2013-07-09 DIAGNOSIS — C8299 Follicular lymphoma, unspecified, extranodal and solid organ sites: Secondary | ICD-10-CM

## 2013-07-09 LAB — CBC WITH DIFFERENTIAL (CANCER CENTER ONLY)
BASO#: 0 10*3/uL (ref 0.0–0.2)
BASO%: 0.7 % (ref 0.0–2.0)
EOS ABS: 0.1 10*3/uL (ref 0.0–0.5)
EOS%: 1.4 % (ref 0.0–7.0)
HEMATOCRIT: 41.3 % (ref 34.8–46.6)
HEMOGLOBIN: 15 g/dL (ref 11.6–15.9)
LYMPH#: 1.1 10*3/uL (ref 0.9–3.3)
LYMPH%: 20.1 % (ref 14.0–48.0)
MCH: 29.8 pg (ref 26.0–34.0)
MCHC: 36.3 g/dL — ABNORMAL HIGH (ref 32.0–36.0)
MCV: 82 fL (ref 81–101)
MONO#: 0.3 10*3/uL (ref 0.1–0.9)
MONO%: 4.7 % (ref 0.0–13.0)
NEUT#: 4.1 10*3/uL (ref 1.5–6.5)
NEUT%: 73.1 % (ref 39.6–80.0)
Platelets: 162 10*3/uL (ref 145–400)
RBC: 5.03 10*6/uL (ref 3.70–5.32)
RDW: 13 % (ref 11.1–15.7)
WBC: 5.6 10*3/uL (ref 3.9–10.0)

## 2013-07-09 LAB — CMP (CANCER CENTER ONLY)
ALBUMIN: 4 g/dL (ref 3.3–5.5)
ALT(SGPT): 21 U/L (ref 10–47)
AST: 18 U/L (ref 11–38)
Alkaline Phosphatase: 53 U/L (ref 26–84)
BUN: 14 mg/dL (ref 7–22)
CALCIUM: 9.1 mg/dL (ref 8.0–10.3)
CHLORIDE: 102 meq/L (ref 98–108)
CO2: 31 meq/L (ref 18–33)
CREATININE: 0.7 mg/dL (ref 0.6–1.2)
Glucose, Bld: 95 mg/dL (ref 73–118)
POTASSIUM: 4 meq/L (ref 3.3–4.7)
Sodium: 141 mEq/L (ref 128–145)
Total Bilirubin: 1.6 mg/dl (ref 0.20–1.60)
Total Protein: 6.7 g/dL (ref 6.4–8.1)

## 2013-07-09 LAB — LACTATE DEHYDROGENASE: LDH: 149 U/L (ref 94–250)

## 2013-07-10 NOTE — Progress Notes (Signed)
Hematology and Oncology Follow Up Visit  Stacey Livingston [id number] January 29, 1965 48 y.o. 07/10/2013   Principle Diagnosis:   Follicular large cell non-Hodgkin lymphoma.  Current Therapy:    Observation     Interim History:  Ms.  Livingston is back for followup. She is under a lot of stress. I think there is a lot going on with her family. This really has taken a lot out of her. I think there might be some financial issues. Her husband may be having some health issues. Her daughter has always had some problems have caused her to be hospitalized. It seems like everything has fallen onto her. She is quite emotional. I think it might be nice if she could talk to our psychologist, Dr. Ferdinand Lango. We gave Stacey Livingston some information and her phone number.  Physically, she's been doing okay. She's gaining weight. She's not too happy about this.  I think she really needs a family doctor. I don't think she has one. I will see about referring her to Dr.Schoenhoff or Dr. Dion Saucier.  I do think she's having her cycles. She does not see a gynecologist.  She's had no fever. She's had no bleeding. He's had no change in bowel or bladder habits. Am not sure when her last mammogram was. We need to order this.   Medications: Current outpatient prescriptions:calcium carbonate 1250 MG capsule, Take 1,250 mg by mouth daily. FORGETS TO TAKE, Disp: , Rfl: ;  cholecalciferol (VITAMIN D) 1000 UNITS tablet, Take 1,000 Units by mouth daily. FORGETS TO TAKE, Disp: , Rfl:   Allergies:  Allergies  Allergen Reactions  . Penicillins Rash    Past Medical History, Surgical history, Social history, and Family History were reviewed and updated.  Review of Systems: As above  Physical Exam:  height is 5\' 7"  (1.702 m) and weight is 246 lb (111.585 kg). Her oral temperature is 97 F (36.1 C). Her blood pressure is 114/77 and her pulse is 68. Her respiration is 14.   Obese white female. Her head and neck exam shows no ocular or  oral lesions. She has no palpable cervical or supraclavicular lymph nodes. Lungs are clear bilaterally. Cardiac exam regular in rhythm with no murmurs rubs or bruits. Abdomen is soft. She has good bowel sounds. There is no palpable liver or spleen tip. Neck exam shows no tenderness over the spine ribs or hips. Extremities shows no clubbing cyanosis or edema. Neurological exam shows no focal neurological deficits. Skin exam no rashes.  Lab Results  Component Value Date   WBC 5.6 07/09/2013   HGB 15.0 07/09/2013   HCT 41.3 07/09/2013   MCV 82 07/09/2013   PLT 162 07/09/2013     Chemistry      Component Value Date/Time   NA 141 07/09/2013 1024   NA 139 01/08/2013 1012   K 4.0 07/09/2013 1024   K 4.1 01/08/2013 1012   CL 102 07/09/2013 1024   CL 101 01/08/2013 1012   CO2 31 07/09/2013 1024   CO2 29 01/08/2013 1012   BUN 14 07/09/2013 1024   BUN 11 01/08/2013 1012   CREATININE 0.7 07/09/2013 1024   CREATININE 0.72 01/08/2013 1012      Component Value Date/Time   CALCIUM 9.1 07/09/2013 1024   CALCIUM 9.3 01/08/2013 1012   ALKPHOS 53 07/09/2013 1024   ALKPHOS 76 01/08/2013 1012   AST 18 07/09/2013 1024   AST 13 01/08/2013 1012   ALT 21 07/09/2013 1024   ALT 17 01/08/2013  1012   BILITOT 1.60 07/09/2013 1024   BILITOT 1.0 01/08/2013 1012         Impression and Plan: Stacey Livingston is a 48 year old white female with a past history of follicular large cell lymphoma. She completed chemotherapy with Rituxan/Treanda patient 6 cycles. She then had 2 years of maintenance therapy with Rituxan. This was completed in May of 2013.  I have recommended that her Port-A-Cath is out. This was taken out back in December.  Again, we will see about getting her referred to psychology. Hopefully Dr. Ferdinand Lango can help. We also will refer her to internal medicine because he really needs a family doctor. She also needs a mammogram.  We will see her back in 6 months.   Volanda Napoleon, MD 6/16/20157:00 AM

## 2013-07-11 ENCOUNTER — Telehealth: Payer: Self-pay | Admitting: Hematology & Oncology

## 2013-07-11 NOTE — Telephone Encounter (Signed)
Heather at Dr. Sharin Mons office aware of referral in epic

## 2013-08-07 ENCOUNTER — Encounter: Payer: Self-pay | Admitting: Internal Medicine

## 2013-08-07 ENCOUNTER — Ambulatory Visit (INDEPENDENT_AMBULATORY_CARE_PROVIDER_SITE_OTHER): Payer: Managed Care, Other (non HMO) | Admitting: Internal Medicine

## 2013-08-07 VITALS — BP 115/80 | HR 74 | Resp 16 | Ht 67.0 in | Wt 251.0 lb

## 2013-08-07 DIAGNOSIS — E663 Overweight: Secondary | ICD-10-CM

## 2013-08-07 DIAGNOSIS — C8280 Other types of follicular lymphoma, unspecified site: Secondary | ICD-10-CM

## 2013-08-07 DIAGNOSIS — C8299 Follicular lymphoma, unspecified, extranodal and solid organ sites: Secondary | ICD-10-CM

## 2013-08-07 DIAGNOSIS — Z139 Encounter for screening, unspecified: Secondary | ICD-10-CM

## 2013-08-07 NOTE — Progress Notes (Signed)
Subjective:    Patient ID: Stacey Livingston, female    DOB: [birth year], 48 y.o.   MRN: [id number]  HPI  Stacey Livingston is a new pt here for first visit.  PMH of NHL diagnosed in 2010 S/p CTX.  Her Port-a-cath has been removed.    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  Last mm 2011  Pap many years ago.   She has two teens at home son and daughter and this is stressful for her.  When mentioning 56 yo daughter she tells me she is not in public school right now and pt became teary.  She did not want to go into detail today   Pt knows she is overweight and needs to improve diet.  She is a non-smoker  Allergies  Allergen Reactions  . Penicillins Rash   Past Medical History  Diagnosis Date  . Large cell, follicular non-Hodgkin's lymphoma 12/14/2010  . Medical history non-contributory    Past Surgical History  Procedure Laterality Date  . Appendectomy    . Port a cath revision  2010    PUT IN  . Port-a-cath removal Right 01/24/2013    Procedure: REMOVAL PORT-A-CATH;  Surgeon: Stacey Hector, MD;  Location: [facility];  Service: General;  Laterality: Right;   History   Social History  . Marital Status: Married    Spouse Name: N/A    Number of Children: N/A  . Years of Education: N/A   Occupational History  . Not on file.   Social History Main Topics  . Smoking status: Never Smoker   . Smokeless tobacco: Never Used     Comment: never used tobacco  . Alcohol Use: Yes     Comment: occ  . Drug Use: No  . Sexual Activity: Not on file   Other Topics Concern  . Not on file   Social History Narrative  . No narrative on file   No family history on file. Patient Active Problem List   Diagnosis Date Noted  . Large cell, follicular non-Hodgkin's lymphoma 12/14/2010   Current Outpatient Prescriptions on File Prior to Visit    Medication Sig Dispense Refill  . calcium carbonate 1250 MG capsule Take 1,250 mg by mouth daily. FORGETS TO TAKE      . cholecalciferol (VITAMIN D) 1000 UNITS tablet Take 1,000 Units by mouth daily. FORGETS TO TAKE       No current facility-administered medications on file prior to visit.      Review of Systems See HPI    Objective:   Physical Exam   Physical Exam  Nursing note and vitals reviewed.  Constitutional: She is oriented to person, place, and time. She appears well-developed and well-nourished.  HENT:  Head: Normocephalic and atraumatic.  Cardiovascular: Normal rate and regular rhythm. Exam reveals no gallop and no friction rub.  No murmur heard.  Pulmonary/Chest: Breath sounds normal. She has no wheezes. She has no rales.  Neurological: She is alert and oriented to person, place, and time.  Skin: Skin is warm and dry.  Psychiatric: She has a normal mood and affect. Her behavior is normal.            Assessment & Plan:  HM  Will schedule 3d mm and return for pap and gyn exam    Get TSH,  vitamin D and cholesterol today  NHL  Doing well PAC removed  Obesity  Will discuss further next visit  Situational/family  stress will explore further next visit

## 2013-08-07 NOTE — Patient Instructions (Signed)
Schedule 3 D mammogram at the [facility]  Schedule 30 min appointment for pap smear only  Schedule CPE in 2016

## 2013-08-27 ENCOUNTER — Ambulatory Visit: Payer: Managed Care, Other (non HMO) | Admitting: Internal Medicine

## 2013-11-26 ENCOUNTER — Encounter: Payer: Self-pay | Admitting: Internal Medicine

## 2014-01-07 ENCOUNTER — Ambulatory Visit: Payer: Managed Care, Other (non HMO) | Admitting: Hematology & Oncology

## 2014-01-07 ENCOUNTER — Other Ambulatory Visit: Payer: Managed Care, Other (non HMO) | Admitting: Lab

## 2014-02-07 ENCOUNTER — Other Ambulatory Visit: Payer: Self-pay | Admitting: *Deleted

## 2014-02-07 DIAGNOSIS — Z0000 Encounter for general adult medical examination without abnormal findings: Secondary | ICD-10-CM

## 2014-02-10 NOTE — Progress Notes (Signed)
Subjective:    Patient ID: Stacey Livingston, female    DOB: [birth year], 49 y.o.   MRN: [id number]  HPI  07/2013 note HM Will schedule 3d mm and return for pap and gyn exam Get TSH, vitamin D and cholesterol today  NHL Doing well PAC removed  Obesity Will discuss further next visit  Situational/family stress will explore further next visit  [place] here for CPE  HM  Allergies  Allergen Reactions  . Penicillins Rash   Past Medical History  Diagnosis Date  . Large cell, follicular non-Hodgkin's lymphoma 12/14/2010  . Medical history non-contributory    Past Surgical History  Procedure Laterality Date  . Appendectomy    . Port a cath revision  2010    PUT IN  . Port-a-cath removal Right 01/24/2013    Procedure: REMOVAL PORT-A-CATH;  Surgeon: Adin Hector, MD;  Location: [facility];  Service: General;  Laterality: Right;   History   Social History  . Marital Status: Married    Spouse Name: N/A    Number of Children: N/A  . Years of Education: N/A   Occupational History  . Not on file.   Social History Main Topics  . Smoking status: Never Smoker   . Smokeless tobacco: Never Used     Comment: never used tobacco  . Alcohol Use: Yes     Comment: occ  . Drug Use: No  . Sexual Activity:    Partners: Male   Other Topics Concern  . Not on file   Social History Narrative   Family History  Problem Relation Age of Onset  . Heart disease Maternal Grandmother   . Heart disease Mother   . Heart disease Maternal Grandfather   . COPD Father   . Throat cancer Father    Patient Active Problem List   Diagnosis Date Noted  . Overweight 08/07/2013  . Large cell, follicular non-Hodgkin's lymphoma 12/14/2010   Current Outpatient Prescriptions on File Prior to Visit  Medication Sig Dispense Refill  . calcium carbonate 1250 MG capsule Take 1,250 mg by mouth daily. FORGETS TO TAKE    . cholecalciferol (VITAMIN D) 1000 UNITS tablet  Take 1,000 Units by mouth daily. FORGETS TO TAKE     No current facility-administered medications on file prior to visit.        Review of Systems  Respiratory: Negative for cough, chest tightness, shortness of breath and wheezing.   Cardiovascular: Negative for chest pain, palpitations and leg swelling.  All other systems reviewed and are negative.      Objective:   Physical Exam Physical Exam  Vital signs and nursing note reviewed  Constitutional: She is oriented to person, place, and time. She appears well-developed and well-nourished. She is cooperative.  HENT:  Head: Normocephalic and atraumatic.  Right Ear: Tympanic membrane normal.  Left Ear: Tympanic membrane normal.  Nose: Nose normal.  Mouth/Throat: Oropharynx is clear and moist and mucous membranes are normal. No oropharyngeal exudate or posterior oropharyngeal erythema.  Eyes: Conjunctivae and EOM are normal. Pupils are equal, round, and reactive to light.  Neck: Neck supple. No JVD present. Carotid bruit is not present. No mass and no thyromegaly present.  Cardiovascular: Regular rhythm, normal heart sounds, intact distal pulses and normal pulses.  Exam reveals no gallop and no friction rub.   No murmur heard. Pulses:      Dorsalis pedis pulses are 2+ on the right side, and 2+ on the  left side.  Pulmonary/Chest: Breath sounds normal. She has no wheezes. She has no rhonchi. She has no rales. Right breast exhibits no mass, no nipple discharge and no skin change. Left breast exhibits no mass, no nipple discharge and no skin change.  Abdominal: Soft. Bowel sounds are normal. She exhibits no distension and no mass. There is no hepatosplenomegaly. There is no tenderness. There is no CVA tenderness.  Genitourinary: Rectum normal, vagina normal and uterus normal. Rectal exam shows no mass. Guaiac negative stool. No labial fusion. There is no lesion on the right labia. There is no lesion on the left labia. Cervix exhibits no  motion tenderness. Right adnexum displays no mass, no tenderness and no fullness. Left adnexum displays no mass, no tenderness and no fullness. No erythema around the vagina.  Musculoskeletal:       No active synovitis to any joint.    Lymphadenopathy:       Right cervical: No superficial cervical adenopathy present.      Left cervical: No superficial cervical adenopathy present.       Right axillary: No pectoral and no lateral adenopathy present.       Left axillary: No pectoral and no lateral adenopathy present.      Right: No inguinal adenopathy present.       Left: No inguinal adenopathy present.  Neurological: She is alert and oriented to person, place, and time. She has normal strength and normal reflexes. No cranial nerve deficit or sensory deficit. She displays a negative Romberg sign. Coordination and gait normal.  Skin: Skin is warm and dry. No abrasion, no bruising, no ecchymosis and no rash noted. No cyanosis. Nails show no clubbing.  Psychiatric: She has a normal mood and affect. Her speech is normal and behavior is normal.          Assessment & Plan:         Assessment & Plan:

## 2014-02-11 ENCOUNTER — Encounter: Payer: Managed Care, Other (non HMO) | Admitting: Internal Medicine

## 2014-02-26 ENCOUNTER — Other Ambulatory Visit: Payer: Self-pay | Admitting: Family

## 2016-10-06 ENCOUNTER — Encounter (HOSPITAL_BASED_OUTPATIENT_CLINIC_OR_DEPARTMENT_OTHER): Payer: Self-pay

## 2016-10-06 ENCOUNTER — Emergency Department (HOSPITAL_BASED_OUTPATIENT_CLINIC_OR_DEPARTMENT_OTHER)
Admission: EM | Admit: 2016-10-06 | Discharge: 2016-10-06 | Disposition: A | Discharge status: Home / Self Care | Payer: BLUE CROSS/BLUE SHIELD | Attending: Emergency Medicine | Admitting: Emergency Medicine

## 2016-10-06 ENCOUNTER — Emergency Department (HOSPITAL_BASED_OUTPATIENT_CLINIC_OR_DEPARTMENT_OTHER): Payer: BLUE CROSS/BLUE SHIELD

## 2016-10-06 DIAGNOSIS — R161 Splenomegaly, not elsewhere classified: Secondary | ICD-10-CM | POA: Insufficient documentation

## 2016-10-06 DIAGNOSIS — R1012 Left upper quadrant pain: Secondary | ICD-10-CM | POA: Diagnosis not present

## 2016-10-06 DIAGNOSIS — K7689 Other specified diseases of liver: Secondary | ICD-10-CM | POA: Diagnosis present

## 2016-10-06 DIAGNOSIS — N202 Calculus of kidney with calculus of ureter: Secondary | ICD-10-CM | POA: Diagnosis not present

## 2016-10-06 DIAGNOSIS — C8200 Follicular lymphoma grade I, unspecified site: Secondary | ICD-10-CM | POA: Diagnosis not present

## 2016-10-06 DIAGNOSIS — N200 Calculus of kidney: Secondary | ICD-10-CM | POA: Diagnosis not present

## 2016-10-06 DIAGNOSIS — R195 Other fecal abnormalities: Secondary | ICD-10-CM | POA: Diagnosis not present

## 2016-10-06 DIAGNOSIS — R1010 Upper abdominal pain, unspecified: Secondary | ICD-10-CM | POA: Diagnosis not present

## 2016-10-06 HISTORY — DX: Splenomegaly, not elsewhere classified: R16.1

## 2016-10-06 LAB — COMPREHENSIVE METABOLIC PANEL
ALT: 17 U/L (ref 14–54)
ANION GAP: 8 (ref 5–15)
AST: 17 U/L (ref 15–41)
Albumin: 4.6 g/dL (ref 3.5–5.0)
Alkaline Phosphatase: 66 U/L (ref 38–126)
BUN: 16 mg/dL (ref 6–20)
CHLORIDE: 106 mmol/L (ref 101–111)
CO2: 26 mmol/L (ref 22–32)
CREATININE: 0.66 mg/dL (ref 0.44–1.00)
Calcium: 9.2 mg/dL (ref 8.9–10.3)
Glucose, Bld: 101 mg/dL — ABNORMAL HIGH (ref 65–99)
Potassium: 3.6 mmol/L (ref 3.5–5.1)
Sodium: 140 mmol/L (ref 135–145)
Total Bilirubin: 1.3 mg/dL — ABNORMAL HIGH (ref 0.3–1.2)
Total Protein: 7 g/dL (ref 6.5–8.1)

## 2016-10-06 LAB — URINALYSIS, ROUTINE W REFLEX MICROSCOPIC
Bilirubin Urine: NEGATIVE
Glucose, UA: NEGATIVE mg/dL
Hgb urine dipstick: NEGATIVE
Ketones, ur: NEGATIVE mg/dL
Leukocytes, UA: NEGATIVE
Nitrite: NEGATIVE
PROTEIN: NEGATIVE mg/dL
Specific Gravity, Urine: <1.005 — ABNORMAL LOW (ref 1.005–1.030)
pH: 6.5 (ref 5.0–8.0)

## 2016-10-06 LAB — CBC WITH DIFFERENTIAL/PLATELET
BASOS ABS: 0 10*3/uL (ref 0.0–0.1)
Basophils Relative: 1 %
EOS ABS: 0.1 10*3/uL (ref 0.0–0.7)
EOS PCT: 1 %
HCT: 42.5 % (ref 36.0–46.0)
Hemoglobin: 15.1 g/dL — ABNORMAL HIGH (ref 12.0–15.0)
LYMPHS PCT: 23 %
Lymphs Abs: 1.8 10*3/uL (ref 0.7–4.0)
MCH: 29 pg (ref 26.0–34.0)
MCHC: 35.5 g/dL (ref 30.0–36.0)
MCV: 81.7 fL (ref 78.0–100.0)
Monocytes Absolute: 0.3 10*3/uL (ref 0.1–1.0)
Monocytes Relative: 4 %
NEUTROS PCT: 71 %
Neutro Abs: 5.8 10*3/uL (ref 1.7–7.7)
PLATELETS: 199 10*3/uL (ref 150–400)
RBC: 5.2 MIL/uL — AB (ref 3.87–5.11)
RDW: 13.4 % (ref 11.5–15.5)
WBC: 8 10*3/uL (ref 4.0–10.5)

## 2016-10-06 LAB — PREGNANCY, URINE: Preg Test, Ur: NEGATIVE

## 2016-10-06 LAB — OCCULT BLOOD X 1 CARD TO LAB, STOOL: Fecal Occult Bld: POSITIVE — AB

## 2016-10-06 LAB — LIPASE, BLOOD: LIPASE: 25 U/L (ref 11–51)

## 2016-10-06 MED ORDER — IOPAMIDOL (ISOVUE-300) INJECTION 61%
100.0000 mL | Freq: Once | INTRAVENOUS | Status: AC | PRN
  Administered 2016-10-06: 100 mL via INTRAVENOUS

## 2016-10-06 NOTE — ED Provider Notes (Signed)
[facility] DEPT Provider Note   CSN: [id number] Arrival date & time: 10/06/16  1519     History   Chief Complaint Chief Complaint  Patient presents with  . Abdominal Pain    HPI Stacey Livingston is a 51 y.o. female with a history of large cell follicular non-Hodgkin's lymphoma who presents today for evaluation of 3 weeks of left upper quadrant abdominal discomfort. She reports that she feels like it is full in the area and she gets occasional twinges of pain. She denies any nausea or vomiting, no diarrhea or constipation. She does note that yesterday after a bowel movement when she wiped she noticed a very small amount of bright red blood. Denies any melena. She endourses fatigue, feels like her "temperature control" has been abnormal recently but is unable to further elaborate.   She does not have a history of heart murmurs  She reports that she has not had any primary care or seen her oncologist since 2015 due to insurance and financial concerns.  She completed her chemotherapy for 6 cycles of Rituxan/Treanda and then 2 years of maintenance therapy with Rituxan which was completed in May 2013. Her Port-A-Cath was removed in 2014.    HPI  Past Medical History:  Diagnosis Date  . Large cell, follicular non-Hodgkin's lymphoma ([place]) 12/14/2010  . Medical history non-contributory   . Spleen enlarged     Patient Active Problem List   Diagnosis Date Noted  . Occult blood positive stool 10/06/2016  . Liver cyst 10/06/2016  . Overweight 08/07/2013  . Large cell, follicular non-Hodgkin's lymphoma ([facility]) 12/14/2010    Past Surgical History:  Procedure Laterality Date  . APPENDECTOMY    . PORT A CATH REVISION  2010   PUT IN  . PORT-A-CATH REMOVAL Right 01/24/2013   Procedure: REMOVAL PORT-A-CATH;  Surgeon: Adin Hector, MD;  Location: [facility];  Service: General;  Laterality: Right;    OB History    Gravida Para Term Preterm AB Living   2 2       2    SAB TAB Ectopic Multiple Live Births                   Home Medications    Prior to Admission medications   Not on File    Family History Family History  Problem Relation Age of Onset  . Heart disease Maternal Grandmother   . Heart disease Mother   . Heart disease Maternal Grandfather   . COPD Father   . Throat cancer Father     Social History Social History  Substance Use Topics  . Smoking status: Never Smoker  . Smokeless tobacco: Never Used     Comment: never used tobacco  . Alcohol use Yes     Comment: occ     Allergies   Penicillins   Review of Systems Review of Systems  Constitutional: Positive for fatigue. Negative for activity change, appetite change, chills and fever.  HENT: Negative for ear pain and sore throat.   Eyes: Negative for pain and visual disturbance.  Respiratory: Negative for cough and shortness of breath.   Cardiovascular: Negative for chest pain and palpitations.  Gastrointestinal: Positive for abdominal pain (Feeling full). Negative for constipation, diarrhea, nausea and vomiting.  Genitourinary: Negative for dysuria and hematuria.  Musculoskeletal: Negative for arthralgias and back pain.  Skin: Negative for color change and rash.  Neurological: Negative for seizures, syncope and headaches.  All other systems reviewed and are  negative.    Physical Exam Updated Vital Signs BP 111/82 (BP Location: Left Arm)   Pulse 69   Temp 98.4 F (36.9 C) (Oral)   Resp 18   Ht 5\' 7"  (1.702 m)   Wt 105.8 kg (233 lb 4 oz)   SpO2 99%   BMI 36.53 kg/m   Physical Exam  Constitutional: She appears well-developed and well-nourished. No distress.  HENT:  Head: Normocephalic and atraumatic.  Eyes: Conjunctivae are normal. Right eye exhibits no discharge. Left eye exhibits no discharge. No scleral icterus.  Neck: Normal range of motion.  Cardiovascular: Normal rate, regular rhythm, normal heart sounds and intact distal pulses.   No murmur  heard. Pulmonary/Chest: Effort normal and breath sounds normal. No stridor. No respiratory distress. She has no wheezes. She has no rales.  Abdominal: Soft. Normal appearance and bowel sounds are normal. She exhibits no distension and no mass. There is no tenderness. There is no rebound and no guarding.  Abdomen is obese, unable to adequately assess for hepatomegaly or splenomegaly.  Genitourinary: Rectum normal. Rectal exam shows no external hemorrhoid, no internal hemorrhoid, no fissure and no tenderness.  Musculoskeletal: She exhibits no edema or deformity.  Lymphadenopathy:       Head (right side): No tonsillar and no preauricular adenopathy present.       Head (left side): No tonsillar and no preauricular adenopathy present.    She has no cervical adenopathy.    She has no axillary adenopathy.       Right: No inguinal and no supraclavicular adenopathy present.       Left: No inguinal and no supraclavicular adenopathy present.  Neurological: She is alert. She exhibits normal muscle tone.  Skin: Skin is warm and dry. She is not diaphoretic.  Psychiatric: She has a normal mood and affect. Her behavior is normal.  Nursing note and vitals reviewed.    ED Treatments / Results  Labs (all labs ordered are listed, but only abnormal results are displayed) Labs Reviewed  COMPREHENSIVE METABOLIC PANEL - Abnormal; Notable for the following:       Result Value   Glucose, Bld 101 (*)    Total Bilirubin 1.3 (*)    All other components within normal limits  CBC WITH DIFFERENTIAL/PLATELET - Abnormal; Notable for the following:    RBC 5.20 (*)    Hemoglobin 15.1 (*)    All other components within normal limits  URINALYSIS, ROUTINE W REFLEX MICROSCOPIC - Abnormal; Notable for the following:    Specific Gravity, Urine <1.005 (*)    All other components within normal limits  OCCULT BLOOD X 1 CARD TO LAB, STOOL - Abnormal; Notable for the following:    Fecal Occult Bld POSITIVE (*)    All other  components within normal limits  LIPASE, BLOOD  PREGNANCY, URINE    EKG  EKG Interpretation None       Radiology Ct Abdomen Pelvis W Contrast  Result Date: 10/06/2016 CLINICAL DATA:  Left side abdomen pain for 3 weeks. EXAM: CT ABDOMEN AND PELVIS WITH CONTRAST TECHNIQUE: Multidetector CT imaging of the abdomen and pelvis was performed using the standard protocol following bolus administration of intravenous contrast. CONTRAST:  142mL ISOVUE-300 IOPAMIDOL (ISOVUE-300) INJECTION 61% COMPARISON:  September 23, 2008 FINDINGS: Lower chest: No acute abnormality. Hepatobiliary: Multiple low-density lesions are identified within the liver, largest is in the anterior segment right lobe liver measuring 5.8 x 4.3 cm consistent with cysts. The gallbladder is normal. The biliary tree is normal.  Pancreas: Unremarkable. No pancreatic ductal dilatation or surrounding inflammatory changes. Spleen: The spleen is enlarged measuring 14 cm in length. No focal splenic lesion is identified. Adrenals/Urinary Tract: The adrenal glands are normal. There is a 7 mm nonobstructing stone in the mid to lower pole left kidney. There is no hydronephrosis bilaterally. Bilateral parapelvic renal cysts are identified. The bladder is normal. Stomach/Bowel: Stomach is within normal limits. Patient status post prior appendectomy. No evidence of bowel wall thickening, distention, or inflammatory changes. Vascular/Lymphatic: No significant vascular findings are present. No enlarged abdominal or pelvic lymph nodes. Reproductive: Uterus and bilateral adnexa are stable. Calcified uterine fibroid is noted. Other: No abdominal wall hernia or abnormality. No abdominopelvic ascites. Musculoskeletal: Degenerative joint changes of the spine are identified. Right pars defect of L5 is noted. IMPRESSION: No bowel obstruction or diverticulitis. Splenomegaly. Liver cysts. Nonobstructing stone of left kidney. Electronically Signed   By: Abelardo Diesel M.D.    On: 10/06/2016 18:21    Procedures Procedures (including critical care time)  Medications Ordered in ED Medications  iopamidol (ISOVUE-300) 61 % injection 100 mL (100 mLs Intravenous Contrast Given 10/06/16 1802)     Initial Impression / Assessment and Plan / ED Course  I have reviewed the triage vital signs and the nursing notes.  Pertinent labs & imaging results that were available during my care of the patient were reviewed by me and considered in my medical decision making (see chart for details).    Stacey Livingston presentsfor evaluation of left upper quadrant feelings of fullness. She has a history of large cell follicular non-Hodgkin's lymphoma, and has not had any follow-up or primary care past 3 years.  CMP, CBC obtained with out cause her symptoms found. She does not have a leukocytosis, no lymphocytosis.  Urinalysis was obtained which was normal, urine pregnancy negative.  Based on her history of cancer CT abdomen pelvis was obtained.  CT showed splenomegaly consistent with her feelings of left upper quadrant fullness. There are also multiple incidental findings including kidney stones, multiple cysts. She was informed of all findings including incidentals.    [place] on her reported bright red blood per rectum rectal exam was performed which was Hemoccult positive.  There was no frank melena or blood on physical exam.  She is not anemic, does not use large amounts of ibuprofen, or aspirin.  I instructed her to follow up with her primary care provider regarding this, and all incidental findings. I also instructed her to reestablish care with an oncologist and to follow-up with them regarding her splenomegaly.    The patient was discussed with Dr. Regenia Skeeter who agreed with my plan. Patient will be discharged home. She was given return precautions and states her understanding.   Final Clinical Impressions(s) / ED Diagnoses   Final diagnoses:  Left upper quadrant pain  Splenomegaly    Kidney stone on left side  Liver cyst  Occult blood positive stool    New Prescriptions There are no discharge medications for this patient.    Lorin Glass, PA-C 10/08/16 3295    Sherwood Gambler, MD 10/08/16 1249

## 2016-10-06 NOTE — ED Notes (Signed)
ED Provider at bedside. 

## 2016-10-06 NOTE — ED Notes (Addendum)
Nurse first-pt denies SOB (removed from reg chief c/o)-NAD-O2 sat 99-HR 89- RR 18

## 2016-10-06 NOTE — ED Triage Notes (Signed)
C/o left side abd pain x 3 weeks-NAD-steady gait

## 2016-10-06 NOTE — Discharge Instructions (Signed)
Please obtain a primary care provider. Please follow-up with your oncologist for evaluation.   You May consider taking an antacid for your pain such as prilosec or xantac to see if that helps your symptoms.   Please do not engage in any contact sports as if you do you may receive injuries to your spleen that may cause bleeding or severe complications.   Please make sure you drink plenty of fluids to help your kidneys flush out the CT contrast that you were given today.

## 2016-10-06 NOTE — ED Notes (Signed)
Pt has already urinated. She will attempt later.

## 2016-10-13 DIAGNOSIS — N200 Calculus of kidney: Secondary | ICD-10-CM | POA: Diagnosis not present

## 2016-10-13 DIAGNOSIS — R350 Frequency of micturition: Secondary | ICD-10-CM | POA: Diagnosis not present

## 2016-10-18 ENCOUNTER — Other Ambulatory Visit (HOSPITAL_BASED_OUTPATIENT_CLINIC_OR_DEPARTMENT_OTHER): Payer: BLUE CROSS/BLUE SHIELD

## 2016-10-18 ENCOUNTER — Ambulatory Visit (HOSPITAL_BASED_OUTPATIENT_CLINIC_OR_DEPARTMENT_OTHER): Payer: BLUE CROSS/BLUE SHIELD | Admitting: Hematology & Oncology

## 2016-10-18 VITALS — BP 129/79 | HR 74 | Temp 98.9°F | Resp 16 | Wt 231.0 lb

## 2016-10-18 DIAGNOSIS — R109 Unspecified abdominal pain: Secondary | ICD-10-CM | POA: Diagnosis not present

## 2016-10-18 DIAGNOSIS — C8280 Other types of follicular lymphoma, unspecified site: Secondary | ICD-10-CM

## 2016-10-18 DIAGNOSIS — R161 Splenomegaly, not elsewhere classified: Secondary | ICD-10-CM

## 2016-10-18 DIAGNOSIS — Z8572 Personal history of non-Hodgkin lymphomas: Secondary | ICD-10-CM | POA: Diagnosis not present

## 2016-10-18 DIAGNOSIS — N200 Calculus of kidney: Secondary | ICD-10-CM | POA: Diagnosis not present

## 2016-10-18 DIAGNOSIS — D509 Iron deficiency anemia, unspecified: Secondary | ICD-10-CM | POA: Diagnosis not present

## 2016-10-18 LAB — CMP (CANCER CENTER ONLY)
ALT(SGPT): 24 U/L (ref 10–47)
AST: 21 U/L (ref 11–38)
Albumin: 4 g/dL (ref 3.3–5.5)
Alkaline Phosphatase: 79 U/L (ref 26–84)
BUN, Bld: 15 mg/dL (ref 7–22)
CALCIUM: 9.5 mg/dL (ref 8.0–10.3)
CO2: 31 meq/L (ref 18–33)
Chloride: 105 mEq/L (ref 98–108)
Creat: 1 mg/dl (ref 0.6–1.2)
GLUCOSE: 97 mg/dL (ref 73–118)
POTASSIUM: 3.8 meq/L (ref 3.3–4.7)
Sodium: 143 mEq/L (ref 128–145)
Total Bilirubin: 1.4 mg/dl (ref 0.20–1.60)
Total Protein: 6.8 g/dL (ref 6.4–8.1)

## 2016-10-18 LAB — CBC WITH DIFFERENTIAL (CANCER CENTER ONLY)
BASO#: 0 10*3/uL (ref 0.0–0.2)
BASO%: 0.4 % (ref 0.0–2.0)
EOS%: 1 % (ref 0.0–7.0)
Eosinophils Absolute: 0.1 10*3/uL (ref 0.0–0.5)
HEMATOCRIT: 44 % (ref 34.8–46.6)
HGB: 15.6 g/dL (ref 11.6–15.9)
LYMPH#: 1.3 10*3/uL (ref 0.9–3.3)
LYMPH%: 17.8 % (ref 14.0–48.0)
MCH: 29.2 pg (ref 26.0–34.0)
MCHC: 35.5 g/dL (ref 32.0–36.0)
MCV: 82 fL (ref 81–101)
MONO#: 0.4 10*3/uL (ref 0.1–0.9)
MONO%: 5.2 % (ref 0.0–13.0)
NEUT#: 5.4 10*3/uL (ref 1.5–6.5)
NEUT%: 75.6 % (ref 39.6–80.0)
PLATELETS: 203 10*3/uL (ref 145–400)
RBC: 5.34 10*6/uL — ABNORMAL HIGH (ref 3.70–5.32)
RDW: 13 % (ref 11.1–15.7)
WBC: 7.1 10*3/uL (ref 3.9–10.0)

## 2016-10-18 NOTE — Progress Notes (Signed)
Referral MD  Reason for Referral: Chronic splenomegaly; history of follicular large cell non-Hodgkin's lymphoma   Chief Complaint  Patient presents with  . Follow-up  : My spleen is big.  HPI: Stacey Livingston is well-known to me. She is a very nice 51 year old white female. We last saw her 3 years ago. She has a history of follicular large cell non-Hodgkin's lymphoma. This was diagnosed back in 2011. She had Rituxan/Treanda for 6 cycles. She then had 2 years of Rituxan. This was completed in May 2013.  Because of insurance issues, she is osseous for the past 3 years. She does have quite a few family dynamics that she has been trying to manage.  Apparently, she does have a kidney stone. She had abdominal pain. A CT scan was done on September 12. This showed a 7 mm stone in the left kidney. This was non-obstructing. She also was noted to have a spleen that was 14 cm in size. There is no lymphadenopathy. Her liver had a large cyst.  Because of the splenomegaly, she was told that she had to comeback to see [place].  She's had no fever. She's had no weight loss. She's had no cough. His been no change in bowel or bladder habits.  She does not have her monthly cycle.  She does see a urologist regarding the kidney stone.  She has had no swollen nodes.  Overall, her performance status is ECOG 1.    Past Medical History:  Diagnosis Date  . Large cell, follicular non-Hodgkin's lymphoma ([place]) 12/14/2010  . Medical history non-contributory   . Spleen enlarged   :  Past Surgical History:  Procedure Laterality Date  . APPENDECTOMY    . PORT A CATH REVISION  2010   PUT IN  . PORT-A-CATH REMOVAL Right 01/24/2013   Procedure: REMOVAL PORT-A-CATH;  Surgeon: Adin Hector, MD;  Location: [facility];  Service: General;  Laterality: Right;  :  No current outpatient prescriptions on file.:  :  Allergies  Allergen Reactions  . Penicillins Rash  :  Family History  Problem  Relation Age of Onset  . Heart disease Maternal Grandmother   . Heart disease Mother   . Heart disease Maternal Grandfather   . COPD Father   . Throat cancer Father   :  Social History   Social History  . Marital status: Married    Spouse name: N/A  . Number of children: N/A  . Years of education: N/A   Occupational History  . Not on file.   Social History Main Topics  . Smoking status: Never Smoker  . Smokeless tobacco: Never Used     Comment: never used tobacco  . Alcohol use Yes     Comment: occ  . Drug use: No  . Sexual activity: Not on file   Other Topics Concern  . Not on file   Social History Narrative  . No narrative on file  :  Pertinent items are noted in HPI.  Exam:Well-developed and well-nourished white female in no obvious distress. Her vital signs show a temperature of 98.9. Pulse 74. Blood pressure 129/79. Weight is 232 pounds. Head and neck exam shows no ocular or oral lesions. She has no adenopathy in the neck or supraclavicular fossa. Lungs are clear bilaterally. Cardiac exam regular rate and rhythm with no murmurs, rubs or bruits. Abdomen is soft. She has good bowel sounds. There is no fluid wave. There is no guarding or rebound tenderness. There is  no palpable hepatomegaly. Her spleen tip might be palpable with deep inspiration at the left costal margin. Inguinal exam shows no inguinal adenopathy bilaterally. Extremities shows no clubbing, cyanosis or edema. Neurological exam shows no focal neurological deficits. Skin exam shows no rashes, ecchymoses or petechia.   Recent Labs  10/18/16 1451  WBC 7.1  HGB 15.6  HCT 44.0  PLT 203    Recent Labs  10/18/16 1451  NA 143  K 3.8  CL 105  CO2 31  GLUCOSE 97  BUN 15  CREATININE 1.0  CALCIUM 9.5    Blood smear review:  None  Pathology: None     Assessment and Plan:  Stacey Livingston is a very charming 51 year old white female. She has a past history of a follicular large cell non-Hodgkin's  lymphoma.  Her splenomegaly actually is better from when we last checked it.  His heart assay whether the abdominal pain is the splenomegaly or if this is from her kidney stone.  She will see the urologist decide as to what might be the treatment for the kidney stone if her pain recurs.  As opposed the abdominal pain could be from her spleen although the spleen really is not all that large.  I cannot find any other signs that would suggest lymphoma recurrent. She only has 17% lymphocytes in her blood.  I will get her peripheral smear. I do not see any immature lymphocytes.  I think what would be best from my point of view is to see her back in 6 months. I would repeat a ultrasound of her abdomen and see how that looks and see how big her spleen is. I think ultrasound would be better as this is not involving radiation and also would be better for her from a financial point of view.  It was wonderful to see her again. I really think that lymphoma is not going to be a problem for her. I think the splenomegaly also will be a chronic issue but hopefully will not be painful.  I spent about 40 minutes with her. It was nice talking with her and catching up with what is going on in her life.

## 2016-10-19 LAB — BETA 2 MICROGLOBULIN, SERUM: Beta-2: 1.7 mg/L (ref 0.6–2.4)

## 2016-10-19 LAB — LACTATE DEHYDROGENASE: LDH: 183 U/L (ref 125–245)

## 2016-10-20 ENCOUNTER — Encounter: Payer: Self-pay | Admitting: Family

## 2016-10-20 ENCOUNTER — Ambulatory Visit (INDEPENDENT_AMBULATORY_CARE_PROVIDER_SITE_OTHER): Payer: BLUE CROSS/BLUE SHIELD | Admitting: Family

## 2016-10-20 VITALS — BP 123/78 | HR 72 | Temp 98.4°F | Resp 18 | Ht 67.0 in | Wt 232.6 lb

## 2016-10-20 DIAGNOSIS — Z87442 Personal history of urinary calculi: Secondary | ICD-10-CM

## 2016-10-20 DIAGNOSIS — C8280 Other types of follicular lymphoma, unspecified site: Secondary | ICD-10-CM

## 2016-10-20 DIAGNOSIS — M25512 Pain in left shoulder: Secondary | ICD-10-CM | POA: Diagnosis not present

## 2016-10-20 DIAGNOSIS — N912 Amenorrhea, unspecified: Secondary | ICD-10-CM

## 2016-10-20 DIAGNOSIS — R161 Splenomegaly, not elsewhere classified: Secondary | ICD-10-CM | POA: Diagnosis not present

## 2016-10-20 DIAGNOSIS — R195 Other fecal abnormalities: Secondary | ICD-10-CM | POA: Diagnosis not present

## 2016-10-20 NOTE — Patient Instructions (Signed)
Please complete lab work prior to leaving.  Welcome to [facility]! 

## 2016-10-20 NOTE — Progress Notes (Signed)
Subjective:    Patient ID: Stacey Livingston, female    DOB: [birth year], 51 y.o.   MRN: [id number]  HPI  Patient is a 51 year old female who presents today to establish care. Past medical history is significant for lymphoma. She last saw her oncologist 2 days ago. This was after a three-year hiatus due to insurance reasons. Her history is positive for follicular large cell non-Hodgkin's lymphoma which was initially diagnosed back in 2011. She completed treatment in May 2013.  History of kidney stones-she is followed by urology. Went to [facility] and was told that they will monitor. Denies flank pain.    Splenomegaly-noted on most recent CT scan. Per her oncologist her spleen size is actually improved from where it was the last time he checked it.  Went to the ED on 9/18 due to some left sided abdominal pain and left shoulder pain.  She was heme positive.  She reports that she saw blood a few days before her visit to the ER.    Still having some left shoulder pain.  She used to play roller derby. Right hand dominant.    LMP was 2010.  Reports rare hot flashes  Review of Systems  Constitutional: Negative for unexpected weight change.  HENT: Negative for hearing loss and rhinorrhea.   Eyes: Negative for visual disturbance.  Respiratory: Negative for cough.   Cardiovascular: Negative for leg swelling.  Gastrointestinal: Negative for abdominal pain and blood in stool.  Genitourinary: Negative for dysuria.       Frequency which she attributes to water consumption  Musculoskeletal: Positive for arthralgias.       Some left sided shoulder pain.    Skin: Negative for rash.  Neurological: Negative for headaches.  Hematological: Negative for adenopathy.  Psychiatric/Behavioral:       Denies depression/anxiety       Past Medical History:  Diagnosis Date  . History of chicken pox   . Large cell, follicular non-Hodgkin's lymphoma ([place]) 12/14/2010  . Medical history non-contributory    . Spleen enlarged      Social History   Social History  . Marital status: Married    Spouse name: N/A  . Number of children: N/A  . Years of education: N/A   Occupational History  . Not on file.   Social History Main Topics  . Smoking status: Never Smoker  . Smokeless tobacco: Never Used     Comment: never used tobacco  . Alcohol use Yes     Comment: occ  . Drug use: No  . Sexual activity: Not on file   Other Topics Concern  . Not on file   Social History Narrative   Cosmetologist   Married for 36 years   Son born [place]   2 dogs   2 cats   3 rabbits   1 snake   Enjoys Social worker, Firefighter, crocheting, animals, travel, listening to family play music   Completed Field seismologist.      Past Surgical History:  Procedure Laterality Date  . APPENDECTOMY    . PORT A CATH REVISION  2010   PUT IN  . PORT-A-CATH REMOVAL Right 01/24/2013   Procedure: REMOVAL PORT-A-CATH;  Surgeon: Adin Hector, MD;  Location: [facility];  Service: General;  Laterality: Right;    Family History  Problem Relation Age of Onset  . Other Maternal Grandmother        mitral valve replacement  . Other Mother  mitral valve replacement  . Atrial fibrillation Mother   . Colon polyps Maternal Grandfather   . COPD Father   . Throat cancer Father   . Vision loss Sister   . Endometriosis Maternal Aunt     Allergies  Allergen Reactions  . Penicillins Rash    No current outpatient prescriptions on file prior to visit.   No current facility-administered medications on file prior to visit.     BP 123/78 (BP Location: Left Arm, Cuff Size: Large)   Pulse 72   Temp 98.4 F (36.9 C) (Oral)   Resp 18   Ht 5\' 7"  (1.702 m)   Wt 232 lb 9.6 oz (105.5 kg)   SpO2 98%   BMI 36.43 kg/m    Objective:   Physical Exam  Constitutional: She is oriented to person, place, and time. She appears well-developed and well-nourished.  HENT:  Head:  Normocephalic and atraumatic.  Eyes: No scleral icterus.  Cardiovascular: Normal rate, regular rhythm and normal heart sounds.   No murmur heard. Pulmonary/Chest: Effort normal and breath sounds normal. No respiratory distress. She has no wheezes.  Abdominal: Soft. Bowel sounds are normal. She exhibits no distension. There is no tenderness. There is no rebound.  Musculoskeletal: She exhibits no edema.  left shoulder is without swelling. She has full range of motion.  Lymphadenopathy:    She has no cervical adenopathy.  Neurological: She is alert and oriented to person, place, and time.  Skin: Skin is warm and dry.  Psychiatric: She has a normal mood and affect. Her behavior is normal. Judgment and thought content normal.          Assessment & Plan:  Left shoulder pain-this is mild. We discussed today using Tylenol as needed for pain. If symptoms worsen or fail to improve would consider referral to sports medicine next visit.

## 2016-10-21 DIAGNOSIS — Z87442 Personal history of urinary calculi: Secondary | ICD-10-CM | POA: Insufficient documentation

## 2016-10-21 DIAGNOSIS — R161 Splenomegaly, not elsewhere classified: Secondary | ICD-10-CM | POA: Insufficient documentation

## 2016-10-21 NOTE — Assessment & Plan Note (Signed)
This is being monitored by her hematologist.

## 2016-10-21 NOTE — Assessment & Plan Note (Signed)
Clinically stable. Followed by urology.

## 2016-10-21 NOTE — Assessment & Plan Note (Signed)
Will refer to GI for further evaluation.  Lab Results  Component Value Date   WBC 7.1 10/18/2016   HGB 15.6 10/18/2016   HCT 44.0 10/18/2016   MCV 82 10/18/2016   PLT 203 10/18/2016   Most recent CBC does not indicate anemia.

## 2016-10-21 NOTE — Assessment & Plan Note (Signed)
Clinically in remission, she is followed by hematology. History of kidney stones

## 2016-10-22 ENCOUNTER — Encounter: Payer: Self-pay | Admitting: Nurse Practitioner

## 2016-11-15 ENCOUNTER — Encounter: Payer: Self-pay | Admitting: Nurse Practitioner

## 2016-11-15 ENCOUNTER — Ambulatory Visit (INDEPENDENT_AMBULATORY_CARE_PROVIDER_SITE_OTHER): Payer: BLUE CROSS/BLUE SHIELD | Admitting: Nurse Practitioner

## 2016-11-15 VITALS — BP 98/60 | HR 62 | Ht 67.0 in | Wt 233.0 lb

## 2016-11-15 DIAGNOSIS — R195 Other fecal abnormalities: Secondary | ICD-10-CM

## 2016-11-15 DIAGNOSIS — K625 Hemorrhage of anus and rectum: Secondary | ICD-10-CM | POA: Diagnosis not present

## 2016-11-15 MED ORDER — NA SULFATE-K SULFATE-MG SULF 17.5-3.13-1.6 GM/177ML PO SOLN: ORAL | 0 refills | Status: DC

## 2016-11-15 NOTE — Progress Notes (Signed)
Chief Complaint:  Episode of rectal bleeding Heme + stool in ED  HPI:  Stacey Livingston is a 51 yo female with a hx of lymphoma (2011) s/p chemoradiation. She is new to the practice, referred by PCP Stacey Alar NP for positive fecal blood test.  In mid September she was seen in ED for a 3 week hx of LUQ pain. Patient was concerned that pain could be from spleen given hx of lymphoma. She also mentions one episode of minor rectal bleeding with an otherwise normal BM the day of, or prior to ED visit.  Stool for occult blood was positive in ED. Hgb is normal at 15.6. No rectal bleeding prior to, or since that one episode and the LUQ pain has resolved. Stacey Livingston doesn't take NSAIDs. She hasn't had any bowel changes such as constipation or diarrhea. Her weight is stable.    Past Medical History:  Diagnosis Date  . History of chicken pox   . Large cell, follicular non-Hodgkin's lymphoma ([place]) 12/14/2010  . Medical history non-contributory   . Spleen enlarged     Past Surgical History:  Procedure Laterality Date  . APPENDECTOMY    . PORT A CATH REVISION  2010   PUT IN  . PORT-A-CATH REMOVAL Right 01/24/2013   Procedure: REMOVAL PORT-A-CATH;  Surgeon: Adin Hector, MD;  Location: [facility];  Service: General;  Laterality: Right;   Family History  Problem Relation Age of Onset  . Other Maternal Grandmother        mitral valve replacement  . Other Mother        mitral valve replacement  . Atrial fibrillation Mother   . Colon polyps Maternal Grandfather   . COPD Father   . Throat cancer Father   . Mental retardation Sister   . Endometriosis Maternal Aunt   . Anorexia nervosa Daughter    Social History  Substance Use Topics  . Smoking status: Never Smoker  . Smokeless tobacco: Never Used     Comment: never used tobacco  . Alcohol use Yes     Comment: occ   No current outpatient prescriptions on file.   No current facility-administered medications for this visit.     Allergies  Allergen Reactions  . Penicillins Rash     Review of Systems: All systems reviewed and negative except where noted in HPI.    Physical Exam: BP 98/60   Pulse 62   Ht 5\' 7"  (1.702 m)   Wt 233 lb (105.7 kg)   BMI 36.49 kg/m  Constitutional:  Well-developed, white female in no acute distress. Psychiatric: Normal mood and affect. Behavior is normal. EENT: Pupils normal.  Conjunctivae are normal. No scleral icterus. Neck supple.  Cardiovascular: Normal rate, regular rhythm. No edema Pulmonary/chest: Effort normal and breath sounds normal. No wheezing, rales or rhonchi. Abdominal: Soft, nondistended. Nontender. Bowel sounds active throughout. There are no masses palpable. No hepatomegaly. Lymphadenopathy: No cervical adenopathy noted. Neurological: Alert and oriented to person place and time. Skin: Skin is warm and dry. No rashes noted.   ASSESSMENT AND PLAN: 60. 51 yo female with one isolated episode of minor rectal bleeding with BM in mid Sept. Tested heme positive on the same day, or day after in the ED which is not surprising. She had been having LUQ pain which was main reason for the ED visit. Labs unremarkable. CT scan did show a mildly enlarged spleen as well as multiple hepatic cysts and a non-obstructing left renal stone.  Basically no acute findings on CT scan. The LUQ pain has resolved.  -suspect the isolated episode of minor, painless rectal bleeding was anorectal in nature. It isn't unreasonable to pursue  colonoscopy to exclude other etiologies,especially since she is less than a year away from being colon cancer screening age. Patient will be scheduled for a colonoscopy with possible polypectomy.  The risks and benefits of the procedure were discussed and the patient agrees to proceed.   2. Hepatic cysts, multiple on CT scan in Sept. Largest measures 5.8. Reviewed CT scan done in 2010 (when diagnosed with lymphoma). Several hepatic cysts were present at that  time as well but they appear larger now. Will probably need follow up imaging soon, perhaps even more advanced imaging to further characterize. Marland Kitchen    Stacey Savoy, NP  11/15/2016, 10:23 AM  Cc: Stacey Alar, NP

## 2016-11-15 NOTE — Patient Instructions (Signed)
If you are age 49 or older, your body mass index should be between 23-30. Your Body mass index is 36.49 kg/m. If this is out of the aforementioned range listed, please consider follow up with your Primary Care Provider.  If you are age 56 or younger, your body mass index should be between 19-25. Your Body mass index is 36.49 kg/m. If this is out of the aformentioned range listed, please consider follow up with your Primary Care Provider.   You have been scheduled for a colonoscopy. Please follow written instructions given to you at your visit today.  Please pick up your prep supplies at the pharmacy within the next 1-3 days. If you use inhalers (even only as needed), please bring them with you on the day of your procedure. Your physician has requested that you go to www.startemmi.com and enter the access code given to you at your visit today. This web site gives a general overview about your procedure. However, you should still follow specific instructions given to you by our office regarding your preparation for the procedure.  You may have a light breakfast the morning of prep day (the day before the procedure).   You may choose from one of the following items: eggs and toast OR chicken noodle soup and crackers.   You should have your breakfast completed between 8:00 and 9:00 am the day before your procedure.    After you have had your light breakfast you should start a clear liquid diet only, NO SOLIDS. No additional solid food is allowed. You may continue to have clear liquid up to 3 hours prior to your procedure.  Thank you for choosing me and [facility].   Tye Savoy, NP

## 2016-11-17 NOTE — Progress Notes (Signed)
Reviewed and agree with documentation and assessment and plan. K. Veena Nandigam , MD   

## 2016-11-29 ENCOUNTER — Encounter: Payer: Self-pay | Admitting: Family

## 2016-11-29 ENCOUNTER — Other Ambulatory Visit (HOSPITAL_COMMUNITY)
Admission: RE | Admit: 2016-11-29 | Discharge: 2016-11-29 | Disposition: A | Discharge status: Home / Self Care | Payer: BLUE CROSS/BLUE SHIELD | Source: Ambulatory Visit | Attending: Family | Admitting: Family

## 2016-11-29 ENCOUNTER — Ambulatory Visit (INDEPENDENT_AMBULATORY_CARE_PROVIDER_SITE_OTHER): Payer: BLUE CROSS/BLUE SHIELD | Admitting: Family

## 2016-11-29 VITALS — BP 104/72 | HR 90 | Temp 98.7°F | Resp 18 | Ht 67.0 in | Wt 232.8 lb

## 2016-11-29 DIAGNOSIS — Z01419 Encounter for gynecological examination (general) (routine) without abnormal findings: Secondary | ICD-10-CM | POA: Insufficient documentation

## 2016-11-29 DIAGNOSIS — N898 Other specified noninflammatory disorders of vagina: Secondary | ICD-10-CM | POA: Diagnosis not present

## 2016-11-29 DIAGNOSIS — Z0000 Encounter for general adult medical examination without abnormal findings: Secondary | ICD-10-CM | POA: Diagnosis not present

## 2016-11-29 DIAGNOSIS — Z23 Encounter for immunization: Secondary | ICD-10-CM

## 2016-11-29 DIAGNOSIS — E348 Other specified endocrine disorders: Secondary | ICD-10-CM

## 2016-11-29 LAB — LIPID PANEL
CHOLESTEROL: 143 mg/dL (ref 0–200)
HDL: 46.6 mg/dL (ref >39.00)
LDL Cholesterol: 80 mg/dL (ref 0–99)
NonHDL: 96.22
Total CHOL/HDL Ratio: 3
Triglycerides: 82 mg/dL (ref 0.0–149.0)
VLDL: 16.4 mg/dL (ref 0.0–40.0)

## 2016-11-29 LAB — CBC WITH DIFFERENTIAL/PLATELET
BASOS PCT: 0.7 % (ref 0.0–3.0)
Basophils Absolute: 0 10*3/uL (ref 0.0–0.1)
EOS PCT: 0.6 % (ref 0.0–5.0)
Eosinophils Absolute: 0 10*3/uL (ref 0.0–0.7)
HCT: 43.2 % (ref 36.0–46.0)
Hemoglobin: 14.7 g/dL (ref 12.0–15.0)
LYMPHS ABS: 0.9 10*3/uL (ref 0.7–4.0)
Lymphocytes Relative: 16.3 % (ref 12.0–46.0)
MCHC: 34.2 g/dL (ref 30.0–36.0)
MCV: 84.3 fl (ref 78.0–100.0)
MONOS PCT: 7.8 % (ref 3.0–12.0)
Monocytes Absolute: 0.4 10*3/uL (ref 0.1–1.0)
NEUTROS ABS: 4 10*3/uL (ref 1.4–7.7)
NEUTROS PCT: 74.6 % (ref 43.0–77.0)
PLATELETS: 177 10*3/uL (ref 150.0–400.0)
RBC: 5.12 Mil/uL — ABNORMAL HIGH (ref 3.87–5.11)
RDW: 13.4 % (ref 11.5–15.5)
WBC: 5.4 10*3/uL (ref 4.0–10.5)

## 2016-11-29 LAB — BASIC METABOLIC PANEL
BUN: 11 mg/dL (ref 6–23)
CALCIUM: 9.4 mg/dL (ref 8.4–10.5)
CO2: 29 meq/L (ref 19–32)
Chloride: 103 mEq/L (ref 96–112)
Creatinine, Ser: 0.74 mg/dL (ref 0.40–1.20)
GFR: 88.58 mL/min (ref >60.00)
GLUCOSE: 90 mg/dL (ref 70–99)
Potassium: 3.7 mEq/L (ref 3.5–5.1)
SODIUM: 139 meq/L (ref 135–145)

## 2016-11-29 LAB — HEPATIC FUNCTION PANEL
ALBUMIN: 4.4 g/dL (ref 3.5–5.2)
ALT: 19 U/L (ref 0–35)
AST: 15 U/L (ref 0–37)
Alkaline Phosphatase: 67 U/L (ref 39–117)
Bilirubin, Direct: 0.2 mg/dL (ref 0.0–0.3)
Total Bilirubin: 1.4 mg/dL — ABNORMAL HIGH (ref 0.2–1.2)
Total Protein: 6.3 g/dL (ref 6.0–8.3)

## 2016-11-29 LAB — TSH: TSH: 0.94 u[IU]/mL (ref 0.35–4.50)

## 2016-11-29 NOTE — Progress Notes (Signed)
Subjective:    Patient ID: Stacey Livingston, female    DOB: 1965-06-10, 51 y.o.   MRN: [id number]  HPI  Pt is a 51 yr old female who presents today for a complete physical.  Immunizations:  Tetanus due, declines flu shot Diet: fair , needs to make time to exercise. Exercise:  Not exercising regularly Colonoscopy: scheduled for next Friday.  Dexa: reports that her last bone  Pap Smear: last pap was in 2011.   Mammogram: due Reports that her periods became quite irregular around age 75.  LMP was 10 years ago.   Vision: Due Dental: Due  Wt Readings from Last 3 Encounters:  11/29/16 232 lb 12.8 oz (105.6 kg)  11/15/16 233 lb (105.7 kg)  10/20/16 232 lb 9.6 oz (105.5 kg)     Review of Systems  Constitutional: Negative for unexpected weight change.  HENT: Negative for hearing loss.        Mild scratchy throat, itchy/watery eyes, son has been sick  Eyes: Negative for visual disturbance.  Respiratory:       Mild cough  Cardiovascular: Negative for leg swelling.  Gastrointestinal: Negative for constipation and diarrhea.  Genitourinary: Negative for dysuria, frequency and hematuria.  Musculoskeletal: Negative for arthralgias and myalgias.  Skin: Negative for rash.  Neurological: Negative for headaches.  Hematological: Negative for adenopathy.  Psychiatric/Behavioral:       Denies depression/anxiety       Past Medical History:  Diagnosis Date  . History of chicken pox   . Large cell, follicular non-Hodgkin's lymphoma ([place]) 12/14/2010  . Medical history non-contributory   . Spleen enlarged      Social History   Socioeconomic History  . Marital status: Married    Spouse name: Not on file  . Number of children: 2  . Years of education: Not on file  . Highest education level: Not on file  Social Needs  . Financial resource strain: Not on file  . Food insecurity - worry: Not on file  . Food insecurity - inability: Not on file  . Transportation needs - medical: Not  on file  . Transportation needs - non-medical: Not on file  Occupational History  . Occupation: cosmetologist  Tobacco Use  . Smoking status: Never Smoker  . Smokeless tobacco: Never Used  . Tobacco comment: never used tobacco  Substance and Sexual Activity  . Alcohol use: Yes    Comment: occ  . Drug use: No  . Sexual activity: Not on file    Comment: husband vasectomy  Other Topics Concern  . Not on file  Social History Narrative   Cosmetologist   Married for 30 years   Son born [place]   2 dogs   2 cats   3 rabbits   1 snake   Enjoys Social worker, Firefighter, crocheting, animals, travel, listening to family play music   Completed Field seismologist.      Past Surgical History:  Procedure Laterality Date  . APPENDECTOMY    . PORT A CATH REVISION  2010   PUT IN    Family History  Problem Relation Age of Onset  . Other Maternal Grandmother        mitral valve replacement  . Other Mother        mitral valve replacement  . Atrial fibrillation Mother   . Colon polyps Maternal Grandfather   . Aneurysm Maternal Grandfather        cardiac  . COPD Father   .  Throat cancer Father   . Mental retardation Sister   . Endometriosis Maternal Aunt   . Anorexia nervosa Daughter     Allergies  Allergen Reactions  . Penicillins Rash    Current Outpatient Medications on File Prior to Visit  Medication Sig Dispense Refill  . Na Sulfate-K Sulfate-Mg Sulf 17.5-3.13-1.6 GM/177ML SOLN Suprep-Use as directed (Patient not taking: Reported on 11/29/2016) 354 mL 0   No current facility-administered medications on file prior to visit.     BP 104/72 (BP Location: Right Arm, Cuff Size: Large)   Pulse 90   Temp 98.7 F (37.1 C) (Oral)   Resp 18   Ht 5\' 7"  (1.702 m)   Wt 232 lb 12.8 oz (105.6 kg)   SpO2 99%   BMI 36.46 kg/m    Objective:   Physical Exam Physical Exam  Constitutional: She is oriented to person, place, and time. She appears well-developed and  well-nourished. No distress.  HENT:  Head: Normocephalic and atraumatic.  Right Ear: Tympanic membrane and ear canal normal.  Left Ear: Tympanic membrane and ear canal normal.  Mouth/Throat: Oropharynx is clear and moist.  Eyes: Pupils are equal, round, and reactive to light. No scleral icterus.  Neck: Normal range of motion. No thyromegaly present.  Cardiovascular: Normal rate and regular rhythm.   No murmur heard. Pulmonary/Chest: Effort normal and breath sounds normal. No respiratory distress. He has no wheezes. She has no rales. She exhibits no tenderness.  Abdominal: Soft. Bowel sounds are normal. She exhibits no distension and no mass. There is no tenderness. There is no rebound and no guarding.  Musculoskeletal: She exhibits no edema.  Lymphadenopathy:    She has no cervical adenopathy.  Neurological: She is alert and oriented to person, place, and time. She has normal patellar reflexes. She exhibits normal muscle tone. Coordination normal.  Skin: Skin is warm and dry.  Psychiatric: She has a normal mood and affect. Her behavior is normal. Judgment and thought content normal.  Breasts: Examined lying Right: Without masses, retractions, discharge or axillary adenopathy.  Left: Without masses, retractions, discharge or axillary adenopathy.  Inguinal/mons: Normal without inguinal adenopathy  External genitalia: Normal  BUS/Urethra/Skene's glands: Normal  Bladder: Normal  Vagina: Normal (vaginal odor noted) Cervix: Normal  Uterus: normal in size, shape and contour. Midline and mobile  Adnexa/parametria:  Rt: Without masses or tenderness.  Lt: Without masses or tenderness.  Anus and perineum: Normal            Assessment & Plan:          Assessment & Plan:  Preventative Care- discussed healthy diet, exercise, weight loss.  Refer for dexa given early menopause (age 5), mammo, colo already scheduled. Tdap today. Obtain routine lab work. EKG tracing is personally  reviewed.  EKG notes NSR.  No acute changes. Pap performed today. Will also send for BV testing due to odor noted on exam.

## 2016-11-29 NOTE — Patient Instructions (Addendum)
Please work on healthy diet, regular exercise and weight loss. Complete lab work prior to leaving. Schedule mammogram and bone density on the first floor. Schedule routine eye exam and vision exam.  Follow up in 1 year, sooner if problems/concerns.

## 2016-11-30 LAB — URINALYSIS, ROUTINE W REFLEX MICROSCOPIC
BILIRUBIN URINE: NEGATIVE
KETONES UR: NEGATIVE
Leukocytes, UA: NEGATIVE
Nitrite: NEGATIVE
Total Protein, Urine: NEGATIVE
UROBILINOGEN UA: 0.2 (ref 0.0–1.0)
Urine Glucose: NEGATIVE
pH: 5 (ref 5.0–8.0)

## 2016-12-01 LAB — CYTOLOGY - PAP
Bacterial vaginitis: NEGATIVE
Diagnosis: NEGATIVE
HPV (WINDOPATH): NOT DETECTED

## 2016-12-02 ENCOUNTER — Encounter: Payer: Self-pay | Admitting: Gastroenterology

## 2016-12-06 ENCOUNTER — Telehealth: Payer: Self-pay | Admitting: Gastroenterology

## 2016-12-06 ENCOUNTER — Ambulatory Visit (HOSPITAL_BASED_OUTPATIENT_CLINIC_OR_DEPARTMENT_OTHER)
Admission: RE | Admit: 2016-12-06 | Discharge: 2016-12-06 | Disposition: A | Discharge status: Home / Self Care | Payer: BLUE CROSS/BLUE SHIELD | Source: Ambulatory Visit | Attending: Family | Admitting: Family

## 2016-12-06 DIAGNOSIS — Z1231 Encounter for screening mammogram for malignant neoplasm of breast: Secondary | ICD-10-CM | POA: Insufficient documentation

## 2016-12-06 DIAGNOSIS — E348 Other specified endocrine disorders: Secondary | ICD-10-CM | POA: Diagnosis not present

## 2016-12-06 DIAGNOSIS — E28310 Symptomatic premature menopause: Secondary | ICD-10-CM | POA: Diagnosis not present

## 2016-12-06 DIAGNOSIS — Z780 Asymptomatic menopausal state: Secondary | ICD-10-CM | POA: Diagnosis not present

## 2016-12-06 DIAGNOSIS — Z0000 Encounter for general adult medical examination without abnormal findings: Secondary | ICD-10-CM

## 2016-12-06 NOTE — Telephone Encounter (Signed)
Patient states pharmacy does not have prep for colon on 11.16.18 and would like it sent to [facility]. Pt hav ov on 10.22.18.

## 2016-12-06 NOTE — Telephone Encounter (Signed)
[facility] to check on prep , they did receive the script but it is on hold because pt has never filled anything there before. L/M for pt to return call

## 2016-12-08 ENCOUNTER — Telehealth: Payer: Self-pay | Admitting: Gastroenterology

## 2016-12-08 NOTE — Telephone Encounter (Signed)
Left message for pt to come pick up a sample suprep kit

## 2016-12-10 ENCOUNTER — Ambulatory Visit (AMBULATORY_SURGERY_CENTER): Payer: BLUE CROSS/BLUE SHIELD | Admitting: Gastroenterology

## 2016-12-10 ENCOUNTER — Encounter: Payer: Self-pay | Admitting: Gastroenterology

## 2016-12-10 ENCOUNTER — Other Ambulatory Visit: Payer: Self-pay

## 2016-12-10 VITALS — BP 111/65 | HR 60 | Temp 99.5°F | Resp 10 | Ht 67.0 in | Wt 233.0 lb

## 2016-12-10 DIAGNOSIS — R195 Other fecal abnormalities: Secondary | ICD-10-CM | POA: Diagnosis not present

## 2016-12-10 DIAGNOSIS — D122 Benign neoplasm of ascending colon: Secondary | ICD-10-CM | POA: Diagnosis not present

## 2016-12-10 MED ORDER — SODIUM CHLORIDE 0.9 % IV SOLN: 500.0000 mL | INTRAVENOUS | Status: DC

## 2016-12-10 NOTE — Progress Notes (Signed)
Pt's states no medical or surgical changes since previsit or office visit. 

## 2016-12-10 NOTE — Progress Notes (Signed)
Called to room to assist during endoscopic procedure.  Patient ID and intended procedure confirmed with present staff. Received instructions for my participation in the procedure from the performing physician.  

## 2016-12-10 NOTE — Op Note (Signed)
[facility] Patient Name: Stacey Livingston Procedure Date: 12/10/2016 3:25 PM MRN: [id number] Endoscopist: Mauri Pole , MD Age: 51 Referring MD:  Date of Birth: October 23, 1965 Gender: Female Account #: [id number] Procedure:                Colonoscopy Indications:              Evaluation of unexplained GI bleeding Medicines:                Monitored Anesthesia Care Procedure:                Pre-Anesthesia Assessment:                           - Prior to the procedure, a History and Physical                            was performed, and patient medications and                            allergies were reviewed. The patient's tolerance of                            previous anesthesia was also reviewed. The risks                            and benefits of the procedure and the sedation                            options and risks were discussed with the patient.                            All questions were answered, and informed consent                            was obtained. Prior Anticoagulants: The patient has                            taken no previous anticoagulant or antiplatelet                            agents. ASA Grade Assessment: II - A patient with                            mild systemic disease. After reviewing the risks                            and benefits, the patient was deemed in                            satisfactory condition to undergo the procedure.                           After obtaining informed consent, the colonoscope  was passed under direct vision. Throughout the                            procedure, the patient's blood pressure, pulse, and                            oxygen saturations were monitored continuously. The                            Colonoscope was introduced through the anus and                            advanced to the the terminal ileum, with                            identification of the  appendiceal orifice and IC                            valve. The colonoscopy was performed without                            difficulty. The patient tolerated the procedure                            well. The quality of the bowel preparation was                            excellent. The terminal ileum, ileocecal valve,                            appendiceal orifice, and rectum were photographed. Scope In: 3:44:16 PM Scope Out: 3:57:25 PM Scope Withdrawal Time: 0 hours 7 minutes 51 seconds  Total Procedure Duration: 0 hours 13 minutes 9 seconds  Findings:                 The perianal and digital rectal examinations were                            normal.                           A 5 mm polyp was found in the ascending colon. The                            polyp was sessile. The polyp was removed with a                            cold snare. Resection and retrieval were complete.                           Non-bleeding internal hemorrhoids were found during                            retroflexion. The hemorrhoids were small. Complications:  No immediate complications. Estimated Blood Loss:     Estimated blood loss was minimal. Impression:               - One 5 mm polyp in the ascending colon, removed                            with a cold snare. Resected and retrieved.                           - Non-bleeding internal hemorrhoids. Recommendation:           - Patient has a contact number available for                            emergencies. The signs and symptoms of potential                            delayed complications were discussed with the                            patient. Return to normal activities tomorrow.                            Written discharge instructions were provided to the                            patient.                           - Resume previous diet.                           - Continue present medications.                           - Await  pathology results.                           - Repeat colonoscopy in 5-10 years for surveillance                            based on pathology results. Mauri Pole, MD 12/10/2016 4:02:07 PM This report has been signed electronically.

## 2016-12-10 NOTE — Progress Notes (Signed)
Report given to PACU, vss 

## 2016-12-10 NOTE — Patient Instructions (Signed)
Handouts given : Polyps and Hemorrhoids    YOU HAD AN ENDOSCOPIC PROCEDURE TODAY AT THE [facility]:   Refer to the procedure report that was given to you for any specific questions about what was found during the examination.  If the procedure report does not answer your questions, please call your gastroenterologist to clarify.  If you requested that your care partner not be given the details of your procedure findings, then the procedure report has been included in a sealed envelope for you to review at your convenience later.  YOU SHOULD EXPECT: Some feelings of bloating in the abdomen. Passage of more gas than usual.  Walking can help get rid of the air that was put into your GI tract during the procedure and reduce the bloating. If you had a lower endoscopy (such as a colonoscopy or flexible sigmoidoscopy) you may notice spotting of blood in your stool or on the toilet paper. If you underwent a bowel prep for your procedure, you may not have a normal bowel movement for a few days.  Please Note:  You might notice some irritation and congestion in your nose or some drainage.  This is from the oxygen used during your procedure.  There is no need for concern and it should clear up in a day or so.  SYMPTOMS TO REPORT IMMEDIATELY:   Following lower endoscopy (colonoscopy or flexible sigmoidoscopy):  Excessive amounts of blood in the stool  Significant tenderness or worsening of abdominal pains  Swelling of the abdomen that is new, acute  Fever of 100F or higher  For urgent or emergent issues, a gastroenterologist can be reached at any hour by calling [phone].   DIET:  We do recommend a small meal at first, but then you may proceed to your regular diet.  Drink plenty of fluids but you should avoid alcoholic beverages for 24 hours.  ACTIVITY:  You should plan to take it easy for the rest of today and you should NOT DRIVE or use heavy machinery until tomorrow (because of the  sedation medicines used during the test).    FOLLOW UP: Our staff will call the number listed on your records the next business day following your procedure to check on you and address any questions or concerns that you may have regarding the information given to you following your procedure. If we do not reach you, we will leave a message.  However, if you are feeling well and you are not experiencing any problems, there is no need to return our call.  We will assume that you have returned to your regular daily activities without incident.  If any biopsies were taken you will be contacted by phone or by letter within the next 1-3 weeks.  Please call us at [phone] if you have not heard about the biopsies in 3 weeks.    SIGNATURES/CONFIDENTIALITY: You and/or your care partner have signed paperwork which will be entered into your electronic medical record.  These signatures attest to the fact that that the information above on your After Visit Summary has been reviewed and is understood.  Full responsibility of the confidentiality of this discharge information lies with you and/or your care-partner. 

## 2016-12-13 ENCOUNTER — Telehealth: Payer: Self-pay | Admitting: *Deleted

## 2016-12-13 ENCOUNTER — Telehealth: Payer: Self-pay

## 2016-12-13 NOTE — Telephone Encounter (Signed)
Called [phone] and left a messaged we tried to reach pt for a follow up call. maw

## 2016-12-13 NOTE — Telephone Encounter (Signed)
Second call no answer, left message to call if questions or concerns.

## 2016-12-24 ENCOUNTER — Encounter: Payer: Self-pay | Admitting: Gastroenterology

## 2017-01-03 ENCOUNTER — Encounter: Payer: Self-pay | Admitting: Gastroenterology

## 2017-02-04 ENCOUNTER — Encounter: Payer: Self-pay | Admitting: Family

## 2017-02-04 DIAGNOSIS — N912 Amenorrhea, unspecified: Secondary | ICD-10-CM

## 2017-04-07 ENCOUNTER — Ambulatory Visit: Payer: Self-pay | Admitting: Family Medicine

## 2017-04-18 ENCOUNTER — Encounter: Payer: Self-pay | Admitting: *Deleted

## 2017-04-18 ENCOUNTER — Inpatient Hospital Stay: Payer: BLUE CROSS/BLUE SHIELD

## 2017-04-18 ENCOUNTER — Inpatient Hospital Stay: Payer: BLUE CROSS/BLUE SHIELD | Attending: Hematology & Oncology | Admitting: Hematology & Oncology

## 2017-04-18 ENCOUNTER — Ambulatory Visit (HOSPITAL_BASED_OUTPATIENT_CLINIC_OR_DEPARTMENT_OTHER)
Admission: RE | Admit: 2017-04-18 | Discharge: 2017-04-18 | Disposition: A | Discharge status: Home / Self Care | Payer: BLUE CROSS/BLUE SHIELD | Source: Ambulatory Visit | Attending: Hematology & Oncology | Admitting: Hematology & Oncology

## 2017-04-18 VITALS — BP 119/76 | HR 73 | Temp 98.4°F | Resp 17 | Wt 234.2 lb

## 2017-04-18 DIAGNOSIS — C8280 Other types of follicular lymphoma, unspecified site: Secondary | ICD-10-CM | POA: Diagnosis not present

## 2017-04-18 DIAGNOSIS — R161 Splenomegaly, not elsewhere classified: Secondary | ICD-10-CM | POA: Diagnosis not present

## 2017-04-18 DIAGNOSIS — Z8572 Personal history of non-Hodgkin lymphomas: Secondary | ICD-10-CM | POA: Diagnosis not present

## 2017-04-18 DIAGNOSIS — C8590 Non-Hodgkin lymphoma, unspecified, unspecified site: Secondary | ICD-10-CM | POA: Diagnosis not present

## 2017-04-18 LAB — CBC WITH DIFFERENTIAL (CANCER CENTER ONLY)
BASOS ABS: 0 10*3/uL (ref 0.0–0.1)
Basophils Relative: 0 %
EOS PCT: 1 %
Eosinophils Absolute: 0.1 10*3/uL (ref 0.0–0.5)
HCT: 42.8 % (ref 34.8–46.6)
Hemoglobin: 14.8 g/dL (ref 11.6–15.9)
LYMPHS PCT: 22 %
Lymphs Abs: 1.5 10*3/uL (ref 0.9–3.3)
MCH: 28.5 pg (ref 26.0–34.0)
MCHC: 34.6 g/dL (ref 32.0–36.0)
MCV: 82.3 fL (ref 81.0–101.0)
MONO ABS: 0.3 10*3/uL (ref 0.1–0.9)
Monocytes Relative: 5 %
Neutro Abs: 4.8 10*3/uL (ref 1.5–6.5)
Neutrophils Relative %: 72 %
PLATELETS: 197 10*3/uL (ref 145–400)
RBC: 5.2 MIL/uL (ref 3.70–5.32)
RDW: 13 % (ref 11.1–15.7)
WBC Count: 6.7 10*3/uL (ref 3.9–10.0)

## 2017-04-18 LAB — CMP (CANCER CENTER ONLY)
ALBUMIN: 4.2 g/dL (ref 3.5–5.0)
ALT: 24 U/L (ref 0–55)
AST: 19 U/L (ref 5–34)
Alkaline Phosphatase: 80 U/L (ref 40–150)
Anion gap: 10 (ref 3–11)
BILIRUBIN TOTAL: 0.7 mg/dL (ref 0.2–1.2)
BUN: 15 mg/dL (ref 7–26)
CO2: 27 mmol/L (ref 22–29)
CREATININE: 0.76 mg/dL (ref 0.60–1.10)
Calcium: 9.6 mg/dL (ref 8.4–10.4)
Chloride: 105 mmol/L (ref 98–109)
GFR, Est AFR Am: >60 mL/min (ref >60)
GFR, Estimated: >60 mL/min (ref >60)
Glucose, Bld: 96 mg/dL (ref 70–140)
POTASSIUM: 4.1 mmol/L (ref 3.5–5.1)
Sodium: 142 mmol/L (ref 136–145)
TOTAL PROTEIN: 6.8 g/dL (ref 6.4–8.3)

## 2017-04-18 LAB — LACTATE DEHYDROGENASE: LDH: 172 U/L (ref 125–245)

## 2017-04-18 NOTE — Progress Notes (Signed)
Hematology and Oncology Follow Up Visit  Stacey Livingston [id number] Mar 29, 1965 52 y.o. 04/18/2017   Principle Diagnosis:   History of follicular large cell non-Hodgkin's lymphoma-remission x 5 years  Splenomegaly-chronic/benign  Current Therapy:    Observation     Interim History:  Stacey Livingston is back for follow-up.  We saw her 6 months ago.  Since then, she is been doing quite well.  She has had no problems with this splenomegaly.  We did go ahead and do a ultrasound of her abdomen today.  Thankfully, the ultrasound did not show anything that looked worse for the splenomegaly.  Her splenic volume was 619 cm.  She has had no abdominal pain.  There is been no change in bowel or bladder habits.  She is had no fever.  She is had no nausea or vomiting.  The big news is that she, her daughter and mother are going to [place] in September.  She really is looking forward to this.  She actually is learning the [employer].  She is had no problems with rashes.  She had no leg swelling.  Is been no change in bowel or bladder habits.  Overall, her performance status is ECOG 0.   Medications: No current outpatient medications on file.  Allergies:  Allergies  Allergen Reactions  . Penicillins Rash    Past Medical History, Surgical history, Social history, and Family History were reviewed and updated.  Review of Systems: Review of Systems  Constitutional: Negative.   HENT:  Negative.   Eyes: Negative.   Respiratory: Negative.   Cardiovascular: Negative.   Gastrointestinal: Negative.   Endocrine: Negative.   Genitourinary: Negative.    Musculoskeletal: Negative.   Skin: Negative.   Neurological: Negative.   Hematological: Negative.   Psychiatric/Behavioral: Negative.     Physical Exam:  weight is 234 lb 4 oz (106.3 kg). Her oral temperature is 98.4 F (36.9 C). Her blood pressure is 119/76 and her pulse is 73. Her respiration is 17 and oxygen saturation is  98%.   Wt Readings from Last 3 Encounters:  04/18/17 234 lb 4 oz (106.3 kg)  12/10/16 233 lb (105.7 kg)  11/29/16 232 lb 12.8 oz (105.6 kg)    Physical Exam  Constitutional: She is oriented to person, place, and time.  HENT:  Head: Normocephalic and atraumatic.  Mouth/Throat: Oropharynx is clear and moist.  Eyes: Pupils are equal, round, and reactive to light. EOM are normal.  Neck: Normal range of motion.  Cardiovascular: Normal rate, regular rhythm and normal heart sounds.  Pulmonary/Chest: Effort normal and breath sounds normal.  Abdominal: Soft. Bowel sounds are normal.  Musculoskeletal: Normal range of motion. She exhibits no edema, tenderness or deformity.  Lymphadenopathy:    She has no cervical adenopathy.  Neurological: She is alert and oriented to person, place, and time.  Skin: Skin is warm and dry. No rash noted. No erythema.  Psychiatric: She has a normal mood and affect. Her behavior is normal. Judgment and thought content normal.  Vitals reviewed.    Lab Results  Component Value Date   WBC 6.7 04/18/2017   HGB 14.7 11/29/2016   HCT 42.8 04/18/2017   MCV 82.3 04/18/2017   PLT 197 04/18/2017     Chemistry      Component Value Date/Time   NA 139 11/29/2016 1428   NA 143 10/18/2016 1451   K 3.7 11/29/2016 1428   K 3.8 10/18/2016 1451   CL 103 11/29/2016 1428  CL 105 10/18/2016 1451   CO2 29 11/29/2016 1428   CO2 31 10/18/2016 1451   BUN 11 11/29/2016 1428   BUN 15 10/18/2016 1451   CREATININE 0.74 11/29/2016 1428   CREATININE 1.0 10/18/2016 1451      Component Value Date/Time   CALCIUM 9.4 11/29/2016 1428   CALCIUM 9.5 10/18/2016 1451   ALKPHOS 67 11/29/2016 1428   ALKPHOS 79 10/18/2016 1451   AST 15 11/29/2016 1428   AST 21 10/18/2016 1451   ALT 19 11/29/2016 1428   ALT 24 10/18/2016 1451   BILITOT 1.4 (H) 11/29/2016 1428   BILITOT 1.40 10/18/2016 1451         Impression and Plan: Stacey Livingston is a 52 year old white female.  She has a  past history of a follicular large cell lymphoma.  She was treated with bendamustine/Rituxan.  She has been in remission.  She now is out almost 6 years.  I do not think this splenomegaly is anything that is pathologic.  It is not changed on ultrasound.  She is asymptomatic.  At this point, I will let her go from the practice.  I just do not think that we have an issue that we have to deal with.  She can always come back if she has issues with her blood.  I am sure that her family doctor, Stacey Livingston, will let us know if she has any changes with her lab work or on a physical exam.   Stacey Napoleon, MD 3/25/201910:03 AM

## 2017-04-18 NOTE — Progress Notes (Signed)
Referral MD  Reason for Referral: Chronic splenomegaly; history of follicular large cell non-Hodgkin's lymphoma   No chief complaint on file. : My spleen is big.  HPI: Stacey Livingston is well-known to me. She is a very nice 52 year old white female. We last saw her 3 years ago. She has a history of follicular large cell non-Hodgkin's lymphoma. This was diagnosed back in 2011. She had Rituxan/Treanda for 6 cycles. She then had 2 years of Rituxan. This was completed in May 2013.  Because of insurance issues, she is osseous for the past 3 years. She does have quite a few family dynamics that she has been trying to manage.  Apparently, she does have a kidney stone. She had abdominal pain. A CT scan was done on September 12. This showed a 7 mm stone in the left kidney. This was non-obstructing. She also was noted to have a spleen that was 14 cm in size. There is no lymphadenopathy. Her liver had a large cyst.  Because of the splenomegaly, she was told that she had to comeback to see [place].  She's had no fever. She's had no weight loss. She's had no cough. His been no change in bowel or bladder habits.  She does not have her monthly cycle.  She does see a urologist regarding the kidney stone.  She has had no swollen nodes.  Overall, her performance status is ECOG 1.     Past Medical History:  Diagnosis Date  . History of chicken pox   . Large cell, follicular non-Hodgkin's lymphoma ([place]) 12/14/2010  . Medical history non-contributory   . Spleen enlarged   :   Past Surgical History:  Procedure Laterality Date  . APPENDECTOMY    . PORT A CATH REVISION  2010   PUT IN  . PORT-A-CATH REMOVAL Right 01/24/2013   Procedure: REMOVAL PORT-A-CATH;  Surgeon: Adin Hector, MD;  Location: [facility];  Service: General;  Laterality: Right;  :  No current outpatient medications on file.:  :   Allergies  Allergen Reactions  . Penicillins Rash  :   Family History  Problem  Relation Age of Onset  . Other Maternal Grandmother        mitral valve replacement  . Other Mother        mitral valve replacement  . Atrial fibrillation Mother   . Colon polyps Maternal Grandfather   . Aneurysm Maternal Grandfather        cardiac  . COPD Father   . Throat cancer Father   . Mental retardation Sister   . Endometriosis Maternal Aunt   . Anorexia nervosa Daughter   :   Social History   Socioeconomic History  . Marital status: Married    Spouse name: Not on file  . Number of children: 2  . Years of education: Not on file  . Highest education level: Not on file  Occupational History  . Occupation: Theatre manager  Social Needs  . Financial resource strain: Not on file  . Food insecurity:    Worry: Not on file    Inability: Not on file  . Transportation needs:    Medical: Not on file    Non-medical: Not on file  Tobacco Use  . Smoking status: Never Smoker  . Smokeless tobacco: Never Used  . Tobacco comment: never used tobacco  Substance and Sexual Activity  . Alcohol use: Yes    Comment: occ  . Drug use: No  . Sexual activity: Not  on file    Comment: husband vasectomy  Lifestyle  . Physical activity:    Days per week: Not on file    Minutes per session: Not on file  . Stress: Not on file  Relationships  . Social connections:    Talks on phone: Not on file    Gets together: Not on file    Attends religious service: Not on file    Active member of club or organization: Not on file    Attends meetings of clubs or organizations: Not on file    Relationship status: Not on file  . Intimate partner violence:    Fear of current or ex partner: Not on file    Emotionally abused: Not on file    Physically abused: Not on file    Forced sexual activity: Not on file  Other Topics Concern  . Not on file  Social History Narrative   Cosmetologist   Married for 31 years   Son born [place]   2 dogs   2 cats   3 rabbits   1 snake   Enjoys  Social worker, knitting, crocheting, animals, travel, listening to family play music   Completed Cosmetology license.    :  Pertinent items are noted in HPI.  Exam:Well-developed and well-nourished white female in no obvious distress. Her vital signs show a temperature of 98.9. Pulse 74. Blood pressure 129/79. Weight is 232 pounds. Head and neck exam shows no ocular or oral lesions. She has no adenopathy in the neck or supraclavicular fossa. Lungs are clear bilaterally. Cardiac exam regular rate and rhythm with no murmurs, rubs or bruits. Abdomen is soft. She has good bowel sounds. There is no fluid wave. There is no guarding or rebound tenderness. There is no palpable hepatomegaly. Her spleen tip might be palpable with deep inspiration at the left costal margin. Inguinal exam shows no inguinal adenopathy bilaterally. Extremities shows no clubbing, cyanosis or edema. Neurological exam shows no focal neurological deficits. Skin exam shows no rashes, ecchymoses or petechia.   Recent Labs    04/18/17 0858  WBC 6.7  HCT 42.8  PLT 197   No results for input(s): NA, K, CL, CO2, GLUCOSE, BUN, CREATININE, CALCIUM in the last 72 hours.  Blood smear review:  None  Pathology: None     Assessment and Plan:  Stacey Livingston is a very charming 52 year old white female. She has a past history of a follicular large cell non-Hodgkin's lymphoma.  Her splenomegaly actually is better from when we last checked it.  His heart assay whether the abdominal pain is the splenomegaly or if this is from her kidney stone.  She will see the urologist decide as to what might be the treatment for the kidney stone if her pain recurs.  As opposed the abdominal pain could be from her spleen although the spleen really is not all that large.  I cannot find any other signs that would suggest lymphoma recurrent. She only has 17% lymphocytes in her blood.  I will get her peripheral smear. I do not see any immature  lymphocytes.  I think what would be best from my point of view is to see her back in 6 months. I would repeat a ultrasound of her abdomen and see how that looks and see how big her spleen is. I think ultrasound would be better as this is not involving radiation and also would be better for her from a financial point of  view.  It was wonderful to see her again. I really think that lymphoma is not going to be a problem for her. I think the splenomegaly also will be a chronic issue but hopefully will not be painful.  I spent about 40 minutes with her. It was nice talking with her and catching up with what is going on in her life.

## 2017-04-19 LAB — BETA 2 MICROGLOBULIN, SERUM: Beta-2 Microglobulin: 1.7 mg/L (ref 0.6–2.4)

## 2017-09-13 ENCOUNTER — Ambulatory Visit (HOSPITAL_BASED_OUTPATIENT_CLINIC_OR_DEPARTMENT_OTHER)
Admission: RE | Admit: 2017-09-13 | Discharge: 2017-09-13 | Disposition: A | Discharge status: Home / Self Care | Payer: BLUE CROSS/BLUE SHIELD | Source: Ambulatory Visit | Attending: Medical | Admitting: Medical

## 2017-09-13 ENCOUNTER — Encounter: Payer: Self-pay | Admitting: Medical

## 2017-09-13 ENCOUNTER — Ambulatory Visit: Payer: BLUE CROSS/BLUE SHIELD | Admitting: Medical

## 2017-09-13 VITALS — BP 113/71 | HR 69 | Temp 98.4°F | Resp 16 | Ht 67.0 in | Wt 232.0 lb

## 2017-09-13 DIAGNOSIS — M79662 Pain in left lower leg: Secondary | ICD-10-CM

## 2017-09-13 NOTE — Patient Instructions (Addendum)
For your left calf area pain may have strained muscle . But do want to get [place] to evaluate if you dvt. Also assess if you may have baker cyst.  Can use compression stocking or can use ace bandage.(provided [place] study negative)  Can use low dose ibuprofen 200-400 mg every 8 hours. But wait for the results first   Follow up in 7-10 days or as needed.

## 2017-09-13 NOTE — Progress Notes (Signed)
Subjective:    Patient ID: Stacey Livingston, female    DOB: 16-Nov-1965, 52 y.o.   MRN: [id number]  HPI  Pt in with some pain sensation in her left pain. Pain present for about week.   She flew to [place] about one month ago. She got back July 23rd. No pain after long trip but slow onset of pain over last week. Notes some mild pain when she walks.   No trauma and no fall. No excess working out. Pt does not smoke. No ocp/hormone use.   Pain on back of calf. Slight pain on calf raise.   No cramping.    Review of Systems  Constitutional: Negative for chills, fatigue and fever.  Respiratory: Negative for cough, chest tightness, shortness of breath and wheezing.   Cardiovascular: Negative for chest pain and palpitations.  Musculoskeletal: Negative for back pain.       Rt calf pain.  Skin: Negative for rash.  Neurological: Negative for dizziness, numbness and headaches.  Hematological: Negative for adenopathy. Does not bruise/bleed easily.  Psychiatric/Behavioral: Negative for behavioral problems.    Past Medical History:  Diagnosis Date  . History of chicken pox   . Large cell, follicular non-Hodgkin's lymphoma ([facility]) 12/14/2010  . Medical history non-contributory   . Spleen enlarged      Social History   Socioeconomic History  . Marital status: Married    Spouse name: Not on file  . Number of children: 2  . Years of education: Not on file  . Highest education level: Not on file  Occupational History  . Occupation: Theatre manager  Social Needs  . Financial resource strain: Not on file  . Food insecurity:    Worry: Not on file    Inability: Not on file  . Transportation needs:    Medical: Not on file    Non-medical: Not on file  Tobacco Use  . Smoking status: Never Smoker  . Smokeless tobacco: Never Used  . Tobacco comment: never used tobacco  Substance and Sexual Activity  . Alcohol use: Yes    Comment: occ  . Drug use: No  . Sexual activity: Not on file   Comment: husband vasectomy  Lifestyle  . Physical activity:    Days per week: Not on file    Minutes per session: Not on file  . Stress: Not on file  Relationships  . Social connections:    Talks on phone: Not on file    Gets together: Not on file    Attends religious service: Not on file    Active member of club or organization: Not on file    Attends meetings of clubs or organizations: Not on file    Relationship status: Not on file  . Intimate partner violence:    Fear of current or ex partner: Not on file    Emotionally abused: Not on file    Physically abused: Not on file    Forced sexual activity: Not on file  Other Topics Concern  . Not on file  Social History Narrative   Cosmetologist   Married for 27 years   Son born [place]   2 dogs   2 cats   3 rabbits   1 snake   Enjoys Social worker, Firefighter, crocheting, animals, travel, listening to family play music   Completed Field seismologist.      Past Surgical History:  Procedure Laterality Date  . APPENDECTOMY    . PORT A CATH  REVISION  2010   PUT IN  . PORT-A-CATH REMOVAL Right 01/24/2013   Procedure: REMOVAL PORT-A-CATH;  Surgeon: Adin Hector, MD;  Location: [facility];  Service: General;  Laterality: Right;    Family History  Problem Relation Age of Onset  . Other Maternal Grandmother        mitral valve replacement  . Other Mother        mitral valve replacement  . Atrial fibrillation Mother   . Colon polyps Maternal Grandfather   . Aneurysm Maternal Grandfather        cardiac  . COPD Father   . Throat cancer Father   . Mental retardation Sister   . Endometriosis Maternal Aunt   . Anorexia nervosa Daughter     Allergies  Allergen Reactions  . Penicillins Rash    No current outpatient medications on file prior to visit.   No current facility-administered medications on file prior to visit.     BP 113/71   Pulse 69   Temp 98.4 F (36.9 C) (Oral)    Resp 16   Ht 5\' 7"  (1.702 m)   Wt 232 lb (105.2 kg)   SpO2 100%   BMI 36.34 kg/m       Objective:   Physical Exam  General Mental Status- Alert. General Appearance- Not in acute distress.   Skin General: Color- Normal Color. Moisture- Normal Moisture.  Neck Carotid Arteries- Normal color. Moisture- Normal Moisture. No carotid bruits. No JVD.  Chest and Lung Exam Auscultation: Breath Sounds:-Normal.  Cardiovascular Auscultation:Rythm- Regular. Murmurs & Other Heart Sounds:Auscultation of the heart reveals- No Murmurs.  Abdomen Inspection:-Inspeection Normal. Palpation/Percussion:Note:No mass. Palpation and Percussion of the abdomen reveal- Non Tender, Non Distended + BS, no rebound or guarding.  Neurologic Cranial Nerve exam:- CN III-XII intact(No nystagmus), symmetric smile. Strength:- 5/5 equal and symmetric strength both upper and lower extremities.  Rt Calf- appears symmetric to left side.  But mild pain on calf raise. Faint bulge popliteal fossae left side. Faint tendnerness to palpation mid calf. No warmth or induration. No fluctuance.        Assessment & Plan:  For your left calf area pain may have strained muscle. But do want to get [place] to evaluate if you dvt. Also assess if you may have baker cyst.  Can use compression stocking or can use ace bandage.(provided [place] study negative)  Can use low dose ibuprofen 200-400 mg every 8 hours. But wait for the results first   Follow up in 7-10 days or as needed.  Mackie Pai, PA-C

## 2017-10-17 ENCOUNTER — Other Ambulatory Visit: Payer: BLUE CROSS/BLUE SHIELD

## 2017-10-17 ENCOUNTER — Ambulatory Visit: Payer: BLUE CROSS/BLUE SHIELD | Admitting: Hematology & Oncology

## 2017-12-02 ENCOUNTER — Encounter: Payer: BLUE CROSS/BLUE SHIELD | Admitting: Family

## 2017-12-09 ENCOUNTER — Ambulatory Visit: Payer: BLUE CROSS/BLUE SHIELD | Admitting: Family

## 2017-12-09 ENCOUNTER — Encounter: Payer: Self-pay | Admitting: Family

## 2017-12-09 DIAGNOSIS — N912 Amenorrhea, unspecified: Secondary | ICD-10-CM | POA: Diagnosis not present

## 2017-12-09 DIAGNOSIS — R221 Localized swelling, mass and lump, neck: Secondary | ICD-10-CM | POA: Diagnosis not present

## 2017-12-09 DIAGNOSIS — Z0000 Encounter for general adult medical examination without abnormal findings: Secondary | ICD-10-CM | POA: Diagnosis not present

## 2017-12-09 LAB — BASIC METABOLIC PANEL
BUN: 15 mg/dL (ref 6–23)
CO2: 31 mEq/L (ref 19–32)
CREATININE: 0.76 mg/dL (ref 0.40–1.20)
Calcium: 9.6 mg/dL (ref 8.4–10.5)
Chloride: 103 mEq/L (ref 96–112)
GFR: 85.54 mL/min (ref >60.00)
Glucose, Bld: 86 mg/dL (ref 70–99)
POTASSIUM: 3.9 meq/L (ref 3.5–5.1)
Sodium: 141 mEq/L (ref 135–145)

## 2017-12-09 LAB — URINALYSIS, ROUTINE W REFLEX MICROSCOPIC
Bilirubin Urine: NEGATIVE
HGB URINE DIPSTICK: NEGATIVE
Ketones, ur: NEGATIVE
Leukocytes, UA: NEGATIVE
Nitrite: NEGATIVE
PH: 6 (ref 5.0–8.0)
SPECIFIC GRAVITY, URINE: 1.01 (ref 1.000–1.030)
TOTAL PROTEIN, URINE-UPE24: NEGATIVE
Urine Glucose: NEGATIVE
Urobilinogen, UA: 0.2 (ref 0.0–1.0)

## 2017-12-09 LAB — CBC WITH DIFFERENTIAL/PLATELET
Basophils Absolute: 0.1 10*3/uL (ref 0.0–0.1)
Basophils Relative: 0.8 % (ref 0.0–3.0)
EOS ABS: 0.1 10*3/uL (ref 0.0–0.7)
EOS PCT: 1 % (ref 0.0–5.0)
HEMATOCRIT: 44.7 % (ref 36.0–46.0)
Hemoglobin: 15.2 g/dL — ABNORMAL HIGH (ref 12.0–15.0)
LYMPHS PCT: 20.9 % (ref 12.0–46.0)
Lymphs Abs: 1.3 10*3/uL (ref 0.7–4.0)
MCHC: 34.1 g/dL (ref 30.0–36.0)
MCV: 83.9 fl (ref 78.0–100.0)
MONO ABS: 0.3 10*3/uL (ref 0.1–1.0)
Monocytes Relative: 4.6 % (ref 3.0–12.0)
Neutro Abs: 4.7 10*3/uL (ref 1.4–7.7)
Neutrophils Relative %: 72.7 % (ref 43.0–77.0)
Platelets: 191 10*3/uL (ref 150.0–400.0)
RBC: 5.33 Mil/uL — AB (ref 3.87–5.11)
RDW: 13.4 % (ref 11.5–15.5)
WBC: 6.4 10*3/uL (ref 4.0–10.5)

## 2017-12-09 LAB — HEPATIC FUNCTION PANEL
ALT: 15 U/L (ref 0–35)
AST: 13 U/L (ref 0–37)
Albumin: 4.7 g/dL (ref 3.5–5.2)
Alkaline Phosphatase: 66 U/L (ref 39–117)
BILIRUBIN DIRECT: 0.2 mg/dL (ref 0.0–0.3)
BILIRUBIN TOTAL: 1.6 mg/dL — AB (ref 0.2–1.2)
Total Protein: 6.4 g/dL (ref 6.0–8.3)

## 2017-12-09 LAB — LIPID PANEL
CHOL/HDL RATIO: 3
Cholesterol: 164 mg/dL (ref 0–200)
HDL: 48.5 mg/dL (ref >39.00)
LDL CALC: 92 mg/dL (ref 0–99)
NONHDL: 115.94
Triglycerides: 119 mg/dL (ref 0.0–149.0)
VLDL: 23.8 mg/dL (ref 0.0–40.0)

## 2017-12-09 LAB — TSH: TSH: 2.59 u[IU]/mL (ref 0.35–4.50)

## 2017-12-09 LAB — FOLLICLE STIMULATING HORMONE: FSH: 94.9 m[IU]/mL

## 2017-12-09 LAB — LUTEINIZING HORMONE: LH: 44.03 m[IU]/mL

## 2017-12-09 NOTE — Progress Notes (Signed)
Subjective:    Patient ID: Stacey Livingston, female    DOB: 1965/07/31, 52 y.o.   MRN: [id number]  HPI  Patient presents today for complete physical.  Immunizations: declines flu shot, declines shingrix.  Tetanus 2018 Diet: fair diet Exercise: walks some Colonoscopy: 12/10/16 Dexa:  2018 Pap Smear:  11/29/16 Mammogram:12/06/16 Vision:  due Dental: due  LMP- had chemo in 2010.  LMP was in 2009.  Stopped around age 53.   Wt Readings from Last 3 Encounters:  12/09/17 231 lb (104.8 kg)  09/13/17 232 lb (105.2 kg)  04/18/17 234 lb 4 oz (106.3 kg)     Review of Systems  Constitutional: Negative for unexpected weight change.  HENT: Positive for rhinorrhea. Negative for hearing loss.   Eyes: Negative for visual disturbance.  Respiratory:       Reports some lingering cough from a recent URI  Cardiovascular: Negative for leg swelling.  Gastrointestinal: Negative for constipation and diarrhea.  Genitourinary: Negative for dysuria, frequency and hematuria.  Musculoskeletal: Negative for arthralgias and myalgias.  Skin: Negative for rash.  Neurological: Negative for headaches.  Hematological: Negative for adenopathy.  Psychiatric/Behavioral:       Denies depression/anxiety   Past Medical History:  Diagnosis Date  . History of chicken pox   . Large cell, follicular non-Hodgkin's lymphoma ([place]) 12/14/2010  . Medical history non-contributory   . Spleen enlarged      Social History   Socioeconomic History  . Marital status: Married    Spouse name: Not on file  . Number of children: 2  . Years of education: Not on file  . Highest education level: Not on file  Occupational History  . Occupation: Theatre manager  Social Needs  . Financial resource strain: Not on file  . Food insecurity:    Worry: Not on file    Inability: Not on file  . Transportation needs:    Medical: Not on file    Non-medical: Not on file  Tobacco Use  . Smoking status: Never Smoker  . Smokeless  tobacco: Never Used  . Tobacco comment: never used tobacco  Substance and Sexual Activity  . Alcohol use: Yes    Comment: occ  . Drug use: No  . Sexual activity: Not on file    Comment: husband vasectomy  Lifestyle  . Physical activity:    Days per week: Not on file    Minutes per session: Not on file  . Stress: Not on file  Relationships  . Social connections:    Talks on phone: Not on file    Gets together: Not on file    Attends religious service: Not on file    Active member of club or organization: Not on file    Attends meetings of clubs or organizations: Not on file    Relationship status: Not on file  . Intimate partner violence:    Fear of current or ex partner: Not on file    Emotionally abused: Not on file    Physically abused: Not on file    Forced sexual activity: Not on file  Other Topics Concern  . Not on file  Social History Narrative   Cosmetologist   Married for 44 years   Son born [place]   2 dogs   2 cats   3 rabbits   1 snake   Enjoys Social worker, Firefighter, crocheting, animals, travel, listening to family play music   Completed Field seismologist.  Past Surgical History:  Procedure Laterality Date  . APPENDECTOMY    . PORT A CATH REVISION  2010   PUT IN  . PORT-A-CATH REMOVAL Right 01/24/2013   Procedure: REMOVAL PORT-A-CATH;  Surgeon: Adin Hector, MD;  Location: [facility];  Service: General;  Laterality: Right;    Family History  Problem Relation Age of Onset  . Other Maternal Grandmother        mitral valve replacement  . Other Mother        mitral valve replacement  . Atrial fibrillation Mother   . Colon polyps Maternal Grandfather   . Aneurysm Maternal Grandfather        cardiac  . COPD Father   . Throat cancer Father   . Mental retardation Sister   . Endometriosis Maternal Aunt   . Anorexia nervosa Daughter     Allergies  Allergen Reactions  . Penicillins Rash    No current  outpatient medications on file prior to visit.   No current facility-administered medications on file prior to visit.     BP 124/70 (BP Location: Right Arm, Patient Position: Sitting, Cuff Size: Large)   Pulse 90   Temp 98.4 F (36.9 C) (Oral)   Resp 16   Ht 5\' 7"  (1.702 m)   Wt 231 lb (104.8 kg)   SpO2 98%   BMI 36.18 kg/m       Objective:   Physical Exam  Physical Exam  Constitutional: She is oriented to person, place, and time. She appears well-developed and well-nourished. No distress.  HENT:  Head: Normocephalic and atraumatic.  Right Ear: Tympanic membrane and ear canal normal.  Left Ear: Tympanic membrane and ear canal normal.  Mouth/Throat: Oropharynx is clear and moist.  Eyes: Pupils are equal, round, and reactive to light. No scleral icterus.  Neck: Normal range of motion. No thyromegaly present.  Cardiovascular: Normal rate and regular rhythm.   No murmur heard. Pulmonary/Chest: Effort normal and breath sounds normal. No respiratory distress. He has no wheezes. She has no rales. She exhibits no tenderness.  Abdominal: Soft. Bowel sounds are normal. She exhibits no distension and no mass. There is no tenderness. There is no rebound and no guarding.  Musculoskeletal: She exhibits no edema.  Lymphadenopathy:    She has right upper anterior cervical firm mass noted Neurological: She is alert and oriented to person, place, and time. She has normal patellar reflexes. She exhibits normal muscle tone. Coordination normal.  Skin: Skin is warm and dry.  Psychiatric: She has a normal mood and affect. Her behavior is normal. Judgment and thought content normal.  Breasts: Examined lying Right: Without masses, retractions, discharge or axillary adenopathy.  Left: Without masses, retractions, discharge or axillary adenopathy.         Assessment & Plan:   Preventative care- discussed healthy diet, exercise and weight loss.  Mammogram and pap up to date. Obtain routine lab  work.   Neck mass- concerning given her history of non-hodgkin's lymphoma. Obtain CT neck to further evaluate.   Amenorrhea- obtain hormonal studies to confirm menopause.     Assessment & Plan:

## 2017-12-09 NOTE — Patient Instructions (Addendum)
Please schedule a routine eye exam and dental.  Please complete lab work prior to leaving. You should be contacted about scheduling your ct scan. Let me know if you have not heard back in 1 week about this.

## 2017-12-10 LAB — HCG, SERUM, QUALITATIVE: PREG SERUM: NEGATIVE

## 2017-12-10 LAB — ESTRADIOL: Estradiol: <15 pg/mL

## 2017-12-11 ENCOUNTER — Telehealth: Payer: Self-pay | Admitting: Family

## 2017-12-11 NOTE — Telephone Encounter (Signed)
Please advise pt that her hormone studies confirm menopause.  Her hemoglobin is a little high and her bilirubin (one of the liver tests) is a little high.  Does she take any iron containing vitamins? If so, please stop.I would like her to return to the lab in 1 month for:  CBC, ferritin, dx elevated hemoglobin   And LFT, dx elevated bilirubin.

## 2017-12-12 ENCOUNTER — Other Ambulatory Visit: Payer: Self-pay

## 2017-12-12 DIAGNOSIS — D582 Other hemoglobinopathies: Secondary | ICD-10-CM

## 2017-12-12 DIAGNOSIS — R17 Unspecified jaundice: Secondary | ICD-10-CM

## 2017-12-12 NOTE — Telephone Encounter (Signed)
Lm for patient to call back for results, needs to get appointment for 1 month labs. Order for labs entered as future

## 2017-12-13 ENCOUNTER — Encounter: Payer: Self-pay | Admitting: Family

## 2017-12-14 NOTE — Telephone Encounter (Signed)
See mychart.  

## 2017-12-14 NOTE — Telephone Encounter (Signed)
Pt was sent a note via mychart with below information.

## 2017-12-16 NOTE — Telephone Encounter (Signed)
Please call patient and make sure she gets her CT neck scheduled. I would like her to complete ASAP.

## 2017-12-16 NOTE — Telephone Encounter (Signed)
Attempted to reach pt and left message to return my call. [place] for [facility] / triage to discuss with pt and schedule lab appt per note below. Pt may also call [phone] to schedule the CT scan soon.

## 2017-12-20 NOTE — Telephone Encounter (Signed)
Attempted to reach pt and left detailed message to call or send [place] a mychart response with her decision about returning for repeat labs and completing CT of neck that was ordered last week. [place] for [facility] / triage to discuss with pt.

## 2017-12-26 ENCOUNTER — Telehealth: Payer: Self-pay | Admitting: Family

## 2017-12-26 ENCOUNTER — Ambulatory Visit (HOSPITAL_BASED_OUTPATIENT_CLINIC_OR_DEPARTMENT_OTHER)
Admission: RE | Admit: 2017-12-26 | Discharge: 2017-12-26 | Disposition: A | Discharge status: Home / Self Care | Payer: BLUE CROSS/BLUE SHIELD | Source: Ambulatory Visit | Attending: Family | Admitting: Family

## 2017-12-26 ENCOUNTER — Other Ambulatory Visit: Payer: BLUE CROSS/BLUE SHIELD

## 2017-12-26 ENCOUNTER — Encounter (HOSPITAL_BASED_OUTPATIENT_CLINIC_OR_DEPARTMENT_OTHER): Payer: Self-pay

## 2017-12-26 DIAGNOSIS — R591 Generalized enlarged lymph nodes: Secondary | ICD-10-CM

## 2017-12-26 DIAGNOSIS — R221 Localized swelling, mass and lump, neck: Secondary | ICD-10-CM | POA: Insufficient documentation

## 2017-12-26 DIAGNOSIS — R590 Localized enlarged lymph nodes: Secondary | ICD-10-CM | POA: Diagnosis not present

## 2017-12-26 MED ORDER — IOPAMIDOL (ISOVUE-300) INJECTION 61%
100.0000 mL | Freq: Once | INTRAVENOUS | Status: AC | PRN
  Administered 2017-12-26: 75 mL via INTRAVENOUS

## 2017-12-26 NOTE — Telephone Encounter (Signed)
I reviewed her CT results.  CT notes some mild enlargement of lymph nodes on the right-hand side as noted on exam as well.  Given her history I would recommend that we get her back into see Dr. Marin Olp to see if he wishes to do any further follow-up.  I am placing the note to get her back in.  Please let me know if she has not heard from their office by the end of the week.  I did attempt to call the patient and I left a message requesting that she call us back.  Okay to share above with her when she returns the call.

## 2017-12-26 NOTE — Telephone Encounter (Signed)
Attempted to reach pt and left message to return my call. [place] for [facility] / Triage to discuss with pt.

## 2017-12-27 ENCOUNTER — Telehealth: Payer: Self-pay | Admitting: Hematology & Oncology

## 2017-12-27 NOTE — Telephone Encounter (Signed)
Left message or pt to return call to office to sch lab/follow up per PCP request. Referral placed but pt is already established with Dr Marin Olp

## 2018-01-02 ENCOUNTER — Telehealth: Payer: Self-pay | Admitting: Hematology & Oncology

## 2018-01-02 NOTE — Telephone Encounter (Signed)
LMOM PPR PT TO RETURN CALL TO OFFICE TO CONFIRM LAB/OV APPT 12/20 AT 1030 AM

## 2018-01-09 ENCOUNTER — Other Ambulatory Visit (INDEPENDENT_AMBULATORY_CARE_PROVIDER_SITE_OTHER): Payer: BLUE CROSS/BLUE SHIELD

## 2018-01-09 DIAGNOSIS — D582 Other hemoglobinopathies: Secondary | ICD-10-CM

## 2018-01-09 DIAGNOSIS — R17 Unspecified jaundice: Secondary | ICD-10-CM

## 2018-01-09 LAB — HEPATIC FUNCTION PANEL
ALBUMIN: 4.4 g/dL (ref 3.5–5.2)
ALK PHOS: 63 U/L (ref 39–117)
ALT: 24 U/L (ref 0–35)
AST: 16 U/L (ref 0–37)
Bilirubin, Direct: 0.2 mg/dL (ref 0.0–0.3)
TOTAL PROTEIN: 6.1 g/dL (ref 6.0–8.3)
Total Bilirubin: 1.1 mg/dL (ref 0.2–1.2)

## 2018-01-09 LAB — CBC
HCT: 44.3 % (ref 36.0–46.0)
Hemoglobin: 15.1 g/dL — ABNORMAL HIGH (ref 12.0–15.0)
MCHC: 34 g/dL (ref 30.0–36.0)
MCV: 83.7 fl (ref 78.0–100.0)
Platelets: 189 10*3/uL (ref 150.0–400.0)
RBC: 5.29 Mil/uL — ABNORMAL HIGH (ref 3.87–5.11)
RDW: 13.4 % (ref 11.5–15.5)
WBC: 5.4 10*3/uL (ref 4.0–10.5)

## 2018-01-09 LAB — FERRITIN: Ferritin: 50.9 ng/mL (ref 10.0–291.0)

## 2018-01-12 ENCOUNTER — Other Ambulatory Visit: Payer: Self-pay | Admitting: *Deleted

## 2018-01-12 DIAGNOSIS — C8280 Other types of follicular lymphoma, unspecified site: Secondary | ICD-10-CM

## 2018-01-13 ENCOUNTER — Inpatient Hospital Stay: Payer: BLUE CROSS/BLUE SHIELD

## 2018-01-13 ENCOUNTER — Other Ambulatory Visit: Payer: Self-pay

## 2018-01-13 ENCOUNTER — Encounter: Payer: Self-pay | Admitting: Hematology & Oncology

## 2018-01-13 ENCOUNTER — Inpatient Hospital Stay: Payer: BLUE CROSS/BLUE SHIELD | Attending: Hematology & Oncology | Admitting: Hematology & Oncology

## 2018-01-13 VITALS — BP 126/52 | HR 70 | Temp 98.5°F | Resp 19 | Wt 236.0 lb

## 2018-01-13 DIAGNOSIS — R161 Splenomegaly, not elsewhere classified: Secondary | ICD-10-CM | POA: Diagnosis not present

## 2018-01-13 DIAGNOSIS — Z79899 Other long term (current) drug therapy: Secondary | ICD-10-CM | POA: Diagnosis not present

## 2018-01-13 DIAGNOSIS — Z9221 Personal history of antineoplastic chemotherapy: Secondary | ICD-10-CM | POA: Insufficient documentation

## 2018-01-13 DIAGNOSIS — C8280 Other types of follicular lymphoma, unspecified site: Secondary | ICD-10-CM

## 2018-01-13 DIAGNOSIS — Z8572 Personal history of non-Hodgkin lymphomas: Secondary | ICD-10-CM

## 2018-01-13 DIAGNOSIS — R590 Localized enlarged lymph nodes: Secondary | ICD-10-CM | POA: Diagnosis not present

## 2018-01-13 LAB — CMP (CANCER CENTER ONLY)
ALT: 21 U/L (ref 0–44)
AST: 14 U/L — AB (ref 15–41)
Albumin: 4.7 g/dL (ref 3.5–5.0)
Alkaline Phosphatase: 75 U/L (ref 38–126)
Anion gap: 7 (ref 5–15)
BUN: 21 mg/dL — ABNORMAL HIGH (ref 6–20)
CO2: 33 mmol/L — AB (ref 22–32)
Calcium: 9.4 mg/dL (ref 8.9–10.3)
Chloride: 104 mmol/L (ref 98–111)
Creatinine: 0.73 mg/dL (ref 0.44–1.00)
GFR, Est AFR Am: >60 mL/min (ref >60)
GFR, Estimated: >60 mL/min (ref >60)
Glucose, Bld: 103 mg/dL — ABNORMAL HIGH (ref 70–99)
Potassium: 4 mmol/L (ref 3.5–5.1)
Sodium: 144 mmol/L (ref 135–145)
Total Bilirubin: 0.9 mg/dL (ref 0.3–1.2)
Total Protein: 6.1 g/dL — ABNORMAL LOW (ref 6.5–8.1)

## 2018-01-13 LAB — CBC WITH DIFFERENTIAL (CANCER CENTER ONLY)
ABS IMMATURE GRANULOCYTES: 0.04 10*3/uL (ref 0.00–0.07)
Basophils Absolute: 0 10*3/uL (ref 0.0–0.1)
Basophils Relative: 1 %
Eosinophils Absolute: 0.1 10*3/uL (ref 0.0–0.5)
Eosinophils Relative: 2 %
HEMATOCRIT: 44.6 % (ref 36.0–46.0)
HEMOGLOBIN: 14.5 g/dL (ref 12.0–15.0)
Immature Granulocytes: 1 %
LYMPHS ABS: 1.2 10*3/uL (ref 0.7–4.0)
Lymphocytes Relative: 15 %
MCH: 28.2 pg (ref 26.0–34.0)
MCHC: 32.5 g/dL (ref 30.0–36.0)
MCV: 86.8 fL (ref 80.0–100.0)
Monocytes Absolute: 0.4 10*3/uL (ref 0.1–1.0)
Monocytes Relative: 5 %
Neutro Abs: 6.3 10*3/uL (ref 1.7–7.7)
Neutrophils Relative %: 76 %
Platelet Count: 184 10*3/uL (ref 150–400)
RBC: 5.14 MIL/uL — ABNORMAL HIGH (ref 3.87–5.11)
RDW: 12.6 % (ref 11.5–15.5)
WBC Count: 8.1 10*3/uL (ref 4.0–10.5)
nRBC: 0 % (ref 0.0–0.2)

## 2018-01-13 LAB — LACTATE DEHYDROGENASE: LDH: 163 U/L (ref 98–192)

## 2018-01-13 NOTE — Progress Notes (Signed)
Hematology and Oncology Follow Up Visit  Stacey Livingston [id number] [birth year] 52 y.o. 01/13/2018   Principle Diagnosis:   History of follicular large cell non-Hodgkin's lymphoma-remission x 5 years  Splenomegaly-chronic/benign  Current Therapy:    Observation     Interim History:  Stacey Livingston is back for follow-up.  There actually might be an issue.  She saw her family physician recently.  There was some slight swelling of some lymph nodes on the right side of her neck.  A CT scan was done.  The CT scan was done on 12/26/2017.  There was some asymmetric and enlarged level 2A lymph nodes measuring 13 mm.  There is a level 3 lymph node measuring 7 mm.  She does have a cold right now.  She is not on any antibiotics.  She has had a very good summer.  I think we last saw her back in the springtime.  She went to [place].  Her 52th birthday was in August.  She had a great time in [place].  She went with her mother.  She has had no fever.  She has had no rashes.  She has had no change in bowel or bladder habits.  She has had no leg swelling.  She has had no headache.  Overall, her performance status is ECOG 0.   Medications:  Current Outpatient Medications:  .  cholecalciferol (VITAMIN D) 1000 units tablet, Take 1,000 Units by mouth daily., Disp: , Rfl:  .  vitamin C (ASCORBIC ACID) 500 MG tablet, Take 500 mg by mouth daily., Disp: , Rfl:   Allergies:  Allergies  Allergen Reactions  . Penicillins Rash    Past Medical History, Surgical history, Social history, and Family History were reviewed and updated.  Review of Systems: Review of Systems  Constitutional: Negative.   HENT:  Negative.   Eyes: Negative.   Respiratory: Negative.   Cardiovascular: Negative.   Gastrointestinal: Negative.   Endocrine: Negative.   Genitourinary: Negative.    Musculoskeletal: Negative.   Skin: Negative.   Neurological: Negative.   Hematological: Negative.   Psychiatric/Behavioral: Negative.      Physical Exam:  weight is 236 lb (107 kg). Her oral temperature is 98.5 F (36.9 C). Her blood pressure is 126/52 (abnormal) and her pulse is 70. Her respiration is 19 and oxygen saturation is 100%.   Wt Readings from Last 3 Encounters:  01/13/18 236 lb (107 kg)  12/09/17 231 lb (104.8 kg)  09/13/17 232 lb (105.2 kg)    Physical Exam Vitals signs reviewed.  HENT:     Head: Normocephalic and atraumatic.  Eyes:     Pupils: Pupils are equal, round, and reactive to light.  Neck:     Musculoskeletal: Normal range of motion.  Cardiovascular:     Rate and Rhythm: Normal rate and regular rhythm.     Heart sounds: Normal heart sounds.  Pulmonary:     Effort: Pulmonary effort is normal.     Breath sounds: Normal breath sounds.  Abdominal:     General: Bowel sounds are normal.     Palpations: Abdomen is soft.  Musculoskeletal: Normal range of motion.        General: No tenderness or deformity.  Lymphadenopathy:     Cervical: No cervical adenopathy.  Skin:    General: Skin is warm and dry.     Findings: No erythema or rash.  Neurological:     Mental Status: She is alert and oriented to person, place, and time.  Psychiatric:        Behavior: Behavior normal.        Thought Content: Thought content normal.        Judgment: Judgment normal.      Lab Results  Component Value Date   WBC 8.1 01/13/2018   HGB 14.5 01/13/2018   HCT 44.6 01/13/2018   MCV 86.8 01/13/2018   PLT 184 01/13/2018     Chemistry      Component Value Date/Time   NA 141 12/09/2017 1132   NA 143 10/18/2016 1451   K 3.9 12/09/2017 1132   K 3.8 10/18/2016 1451   CL 103 12/09/2017 1132   CL 105 10/18/2016 1451   CO2 31 12/09/2017 1132   CO2 31 10/18/2016 1451   BUN 15 12/09/2017 1132   BUN 15 10/18/2016 1451   CREATININE 0.76 12/09/2017 1132   CREATININE 0.76 04/18/2017 0858   CREATININE 1.0 10/18/2016 1451      Component Value Date/Time   CALCIUM 9.6 12/09/2017 1132   CALCIUM 9.5  10/18/2016 1451   ALKPHOS 63 01/09/2018 1055   ALKPHOS 79 10/18/2016 1451   AST 16 01/09/2018 1055   AST 19 04/18/2017 0858   ALT 24 01/09/2018 1055   ALT 24 04/18/2017 0858   ALT 24 10/18/2016 1451   BILITOT 1.1 01/09/2018 1055   BILITOT 0.7 04/18/2017 0858         Impression and Plan: Stacey Livingston is a 52 year old white female.  She has a past history of a follicular large cell lymphoma.  She was treated with bendamustine/Rituxan.  She has been in remission.  She now is out almost 6 years.  It is very hard to figure out what might be going on with this lymph node.  I would like to think that this is not a recurrence.  However, with a follicular lymphoma, a recurrence is always possible.  We will see about getting a PET scan.  I think this is a good way to go for right now.  We will see what the PET scan shows.  If the PET scan is positive in the right neck, then I think we are going to have to get a biopsy.  A complicating issue is the fact that she is pregnant had to change insurance.  She cannot afford the insurance that has the [facility] system.  I hate this for her.  She is quite upset over this.  I can understand this.   Volanda Napoleon, MD 12/20/201911:07 AM

## 2018-01-14 LAB — BETA 2 MICROGLOBULIN, SERUM: Beta-2 Microglobulin: 1.8 mg/L (ref 0.6–2.4)

## 2018-01-31 ENCOUNTER — Telehealth: Payer: Self-pay | Admitting: Hematology & Oncology

## 2018-01-31 NOTE — Telephone Encounter (Signed)
Unable to reach patient to sch PET scan. Lmom for pt to return call to central radiology schedulers to set appts as requested

## 2018-02-02 ENCOUNTER — Telehealth: Payer: Self-pay | Admitting: Hematology & Oncology

## 2018-02-02 NOTE — Telephone Encounter (Signed)
LMVM for patient to call me back in order to schedule PET Scan that has been approved

## 2018-12-15 ENCOUNTER — Encounter: Payer: BLUE CROSS/BLUE SHIELD | Admitting: Family

## 2019-07-12 IMAGING — CT CT NECK W/ CM
3 of 4 series · 13 of 33 positions shown, 16 images · IV contrast (iopamidol)
Comparison: PET-CT 12/01/2009.

CLINICAL DATA: 52-year-old female with right side neck swelling on
routine physical exam. History of treated lymphoma.

EXAM:
CT NECK WITH CONTRAST
TECHNIQUE: Multidetector CT imaging of the neck was performed using the
standard protocol following the bolus administration of intravenous
contrast.
CONTRAST:  75mL [id number] IOPAMIDOL ([id number]) INJECTION 61%

[Series 3: axial neck · axial · 0.55mm/px · z∈[-216,-60]mm · 5 of 118 slices shown, 7 images]
[im 20/118  soft-tissue]
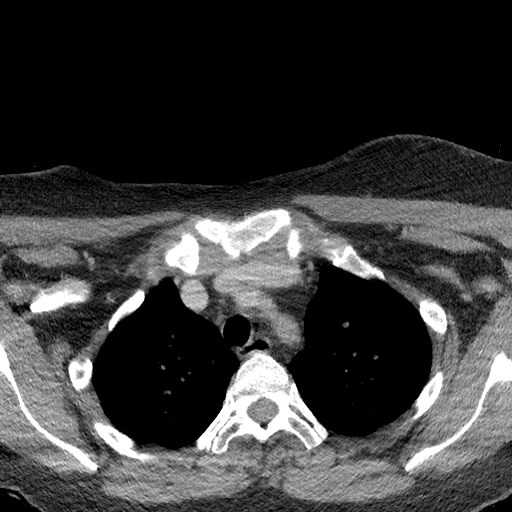
[im 20/118  bone]
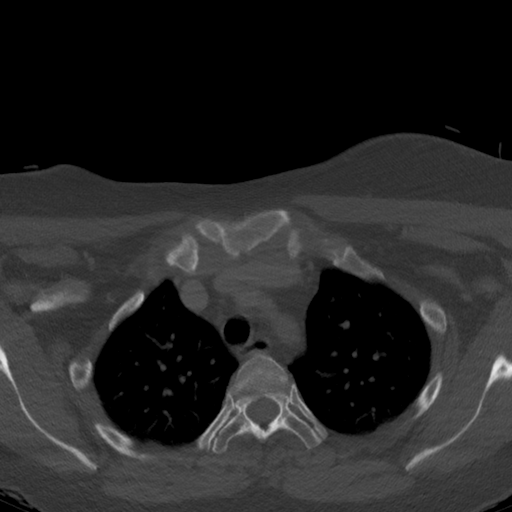
[im 40/118  bone]
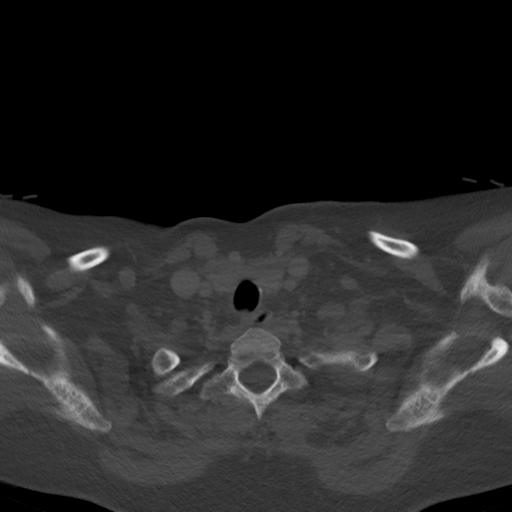
[im 59/118  bone]
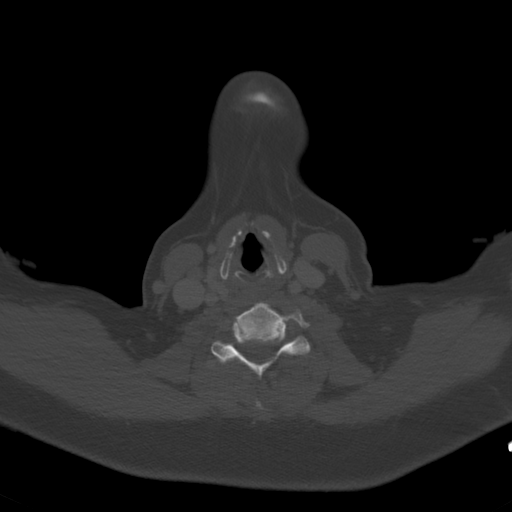
[im 79/118  bone]
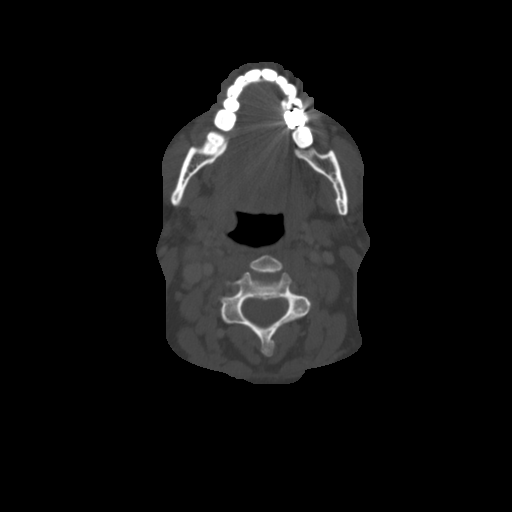
[im 98/118  soft-tissue]
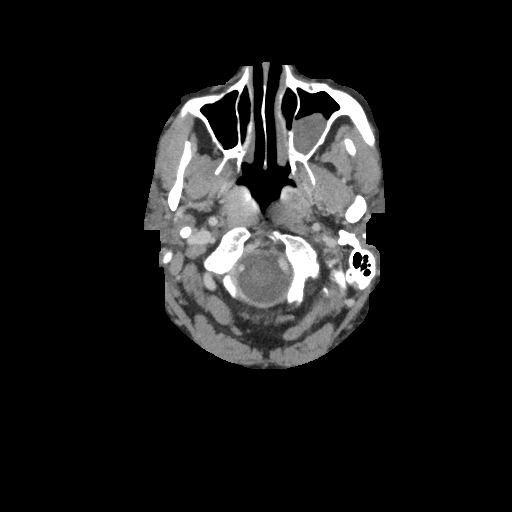
[im 98/118  bone]
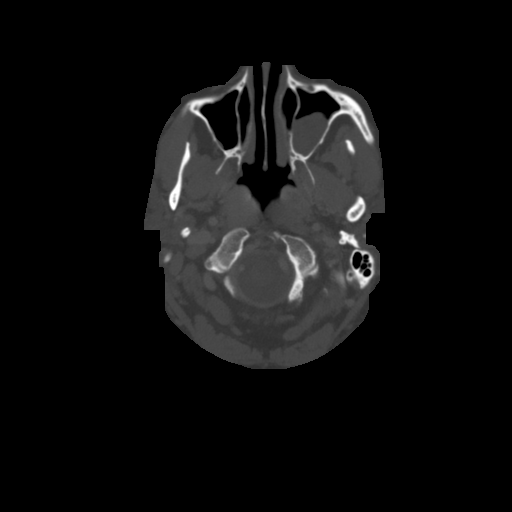

[Series 5: sag neck · sagittal · 0.48mm/px · 5 of 108 slices shown, 6 images]
[im 36/108  bone]
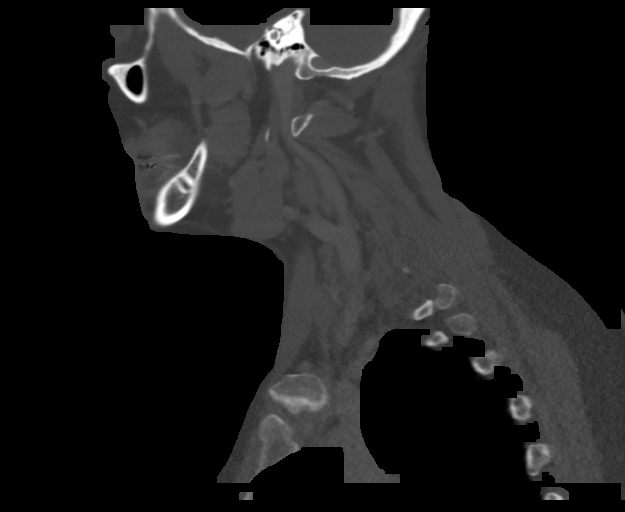
[im 45/108  bone]
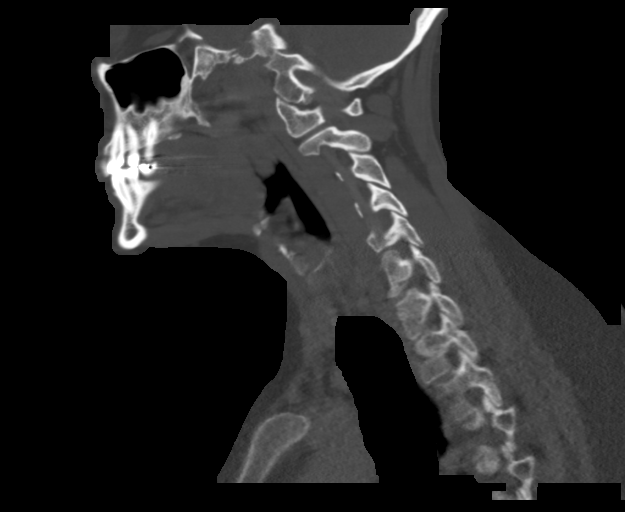
[im 54/108  soft-tissue]
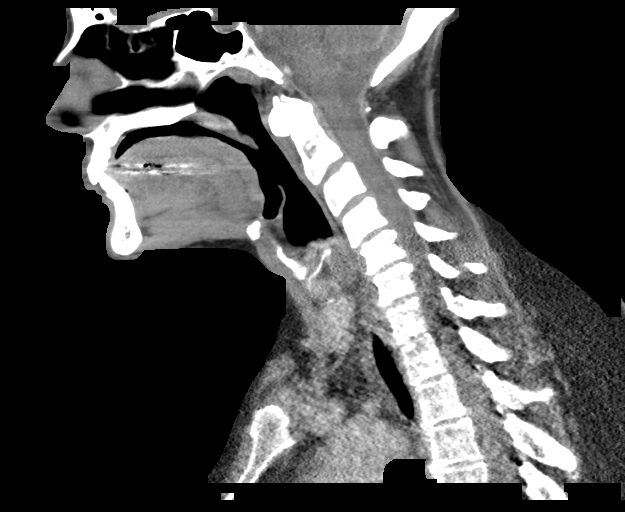
[im 54/108  bone]
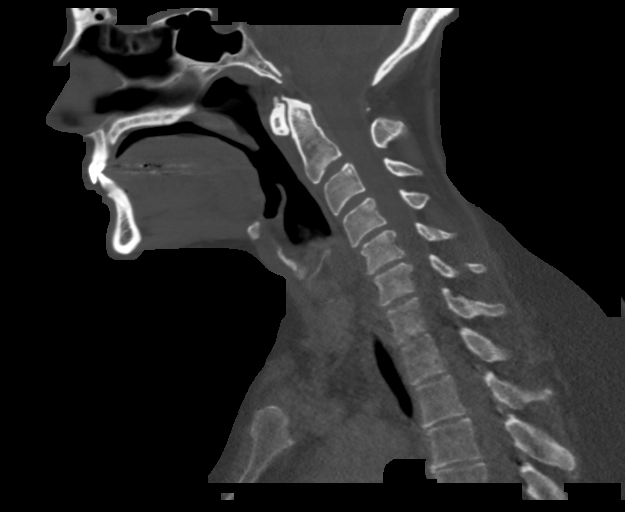
[im 63/108  bone]
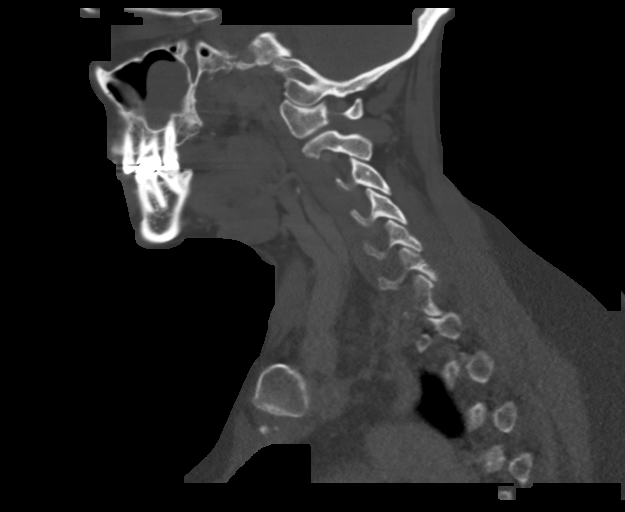
[im 72/108  bone]
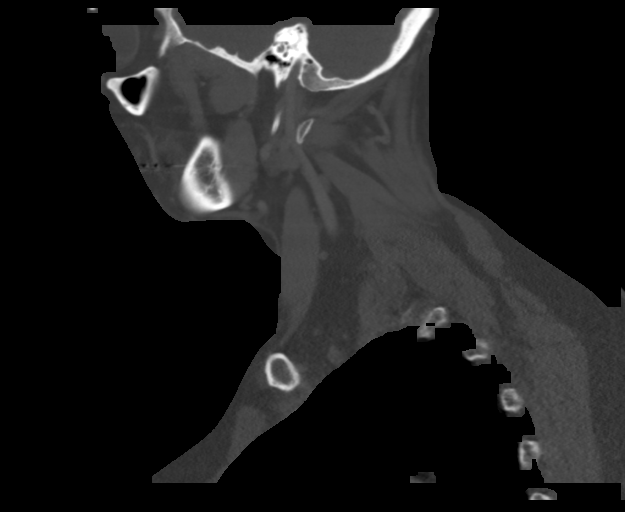

[Series 6: cor neck · coronal · 0.49mm/px · 3 of 158 slices shown]
[im 32/158  bone]
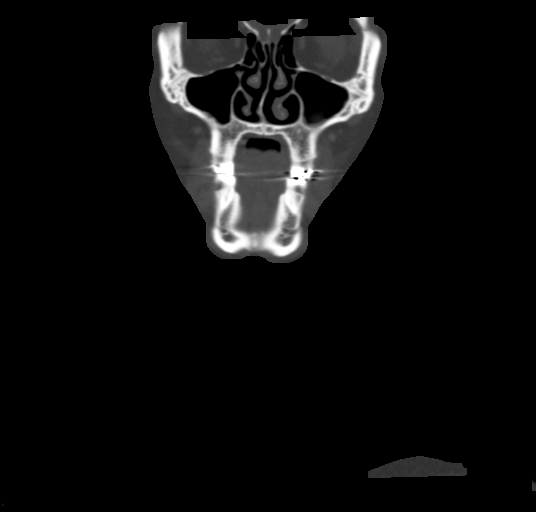
[im 63/158  bone]
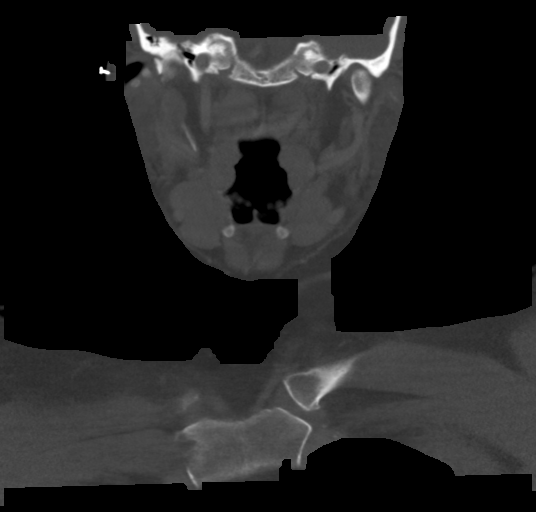
[im 95/158  bone]
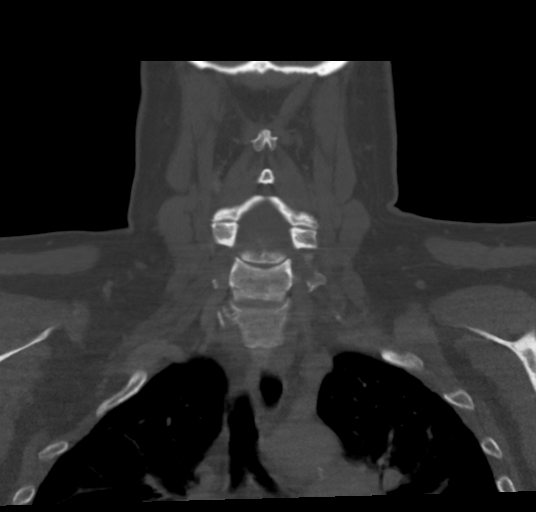

[13 of 33 positions shown; findings below may reference images not displayed]

FINDINGS: Pharynx and larynx: Laryngeal and pharyngeal soft tissue contours
are within normal limits. Negative parapharyngeal and
retropharyngeal spaces.

Salivary glands: Negative sublingual space. The submandibular glands
appear symmetric and within normal limits. Parotid glands appear
symmetric and within normal limits.

Thyroid: Negative.

Lymph nodes: Asymmetric and abnormally enlarged and rounded right
level IIa lymph node measuring 13 millimeters short axis on series
3, image 44. Sagittal images 34 and 35 demonstrate this node as well
as small but asymmetrically enlarged right level 3 nodes,
individually up to 7 millimeter short axis (also series 3, image 61.

No cystic or necrotic nodes.

The contralateral left neck nodes are diminutive, with only
occasional lymph nodes as largest 6 millimeter short axis. Level 1,
level 5, and level 4/thoracic inlet lymph nodes also appear
symmetric and normal.

Vascular: Major vascular structures in the neck and at the skull
base are patent. The left vertebral artery appears dominant. Minimal
carotid atherosclerosis.

Limited intracranial: Negative.

Visualized orbits: Negative.

Mastoids and visualized paranasal sinuses: Chronic mucosal
thickening in the left maxillary alveolar recess has increased and
now resembles a mucous retention cyst. Other visualized paranasal
sinuses and mastoids are stable and well pneumatized.

Skeleton: Mild lower cervical spine disc and endplate degeneration.
No acute osseous abnormality identified.

Upper chest: Visible superior mediastinal lymph nodes and axillary
lymph nodes appear diminutive and normal. No superior mediastinal or
upper lung abnormality.
IMPRESSION: 1. Asymmetrically enlarged right level II and level III lymph nodes,
the largest node at level IIa is abnormal by size criteria and
morphology (13 mm and rounded).
No associated pharyngeal or laryngeal abnormality. All other nodal
stations in the neck and visible upper chest appear normal.
This appearance is nonspecific and these lymph nodes could be
inflammatory, but given the patient's history of Lymphoma an
Ultrasound-guided needle biopsy of the largest node is recommended
if they persist after a trial of antibiotics.
2. Otherwise negative neck CT.

## 2021-10-13 ENCOUNTER — Ambulatory Visit: Payer: Self-pay | Admitting: Medical

## 2021-10-13 VITALS — BP 120/70 | HR 64 | Temp 99.3°F | Wt 219.0 lb

## 2021-10-13 DIAGNOSIS — M2550 Pain in unspecified joint: Secondary | ICD-10-CM

## 2021-10-13 DIAGNOSIS — S80861A Insect bite (nonvenomous), right lower leg, initial encounter: Secondary | ICD-10-CM

## 2021-10-13 DIAGNOSIS — R21 Rash and other nonspecific skin eruption: Secondary | ICD-10-CM

## 2021-10-13 DIAGNOSIS — W57XXXA Bitten or stung by nonvenomous insect and other nonvenomous arthropods, initial encounter: Secondary | ICD-10-CM

## 2021-10-13 DIAGNOSIS — B880 Other acariasis: Secondary | ICD-10-CM

## 2021-10-13 DIAGNOSIS — M2540 Effusion, unspecified joint: Secondary | ICD-10-CM

## 2021-10-13 LAB — CBC WITH DIFFERENTIAL/PLATELET
Basophils Absolute: 0.1 10*3/uL (ref 0.0–0.2)
Basos: 1 %
EOS (ABSOLUTE): 0 10*3/uL (ref 0.0–0.4)
Eos: 0 %
Hematocrit: 45.1 % (ref 34.0–46.6)
Hemoglobin: 15.7 g/dL (ref 11.1–15.9)
Immature Grans (Abs): 0 10*3/uL (ref 0.0–0.1)
Immature Granulocytes: 0 %
Lymphocytes Absolute: 1.9 10*3/uL (ref 0.7–3.1)
Lymphs: 21 %
MCH: 29 pg (ref 26.6–33.0)
MCHC: 34.8 g/dL (ref 31.5–35.7)
MCV: 83 fL (ref 79–97)
Monocytes Absolute: 0.4 10*3/uL (ref 0.1–0.9)
Monocytes: 4 %
Neutrophils Absolute: 6.7 10*3/uL (ref 1.4–7.0)
Neutrophils: 74 %
Platelets: 205 10*3/uL (ref 150–450)
RBC: 5.42 x10E6/uL — ABNORMAL HIGH (ref 3.77–5.28)
RDW: 12.3 % (ref 11.7–15.4)
WBC: 9.1 10*3/uL (ref 3.4–10.8)

## 2021-10-13 MED ORDER — DOXYCYCLINE HYCLATE 100 MG PO TABS: 100.0000 mg | ORAL_TABLET | Freq: Two times a day (BID) | ORAL | 0 refills | Status: AC

## 2021-10-13 MED ORDER — TRIAMCINOLONE ACETONIDE 0.1 % EX CREA: 1.0000 | TOPICAL_CREAM | Freq: Two times a day (BID) | CUTANEOUS | 0 refills | Status: AC

## 2021-10-13 NOTE — Progress Notes (Signed)
Subjective:  Stacey Livingston is a 56 y.o. female who presents for Chief Complaint  Patient presents with   tick bite recently    Tick bite recently back on July 18th, having some chigger bites on her legs, joint pain- both knees and hands,having some swelling at joint site.      Here as a new patient.     Has concerns for rash, joint pains .  Been dealing with chiggers for 2 weeks after hiking at a local park, [facility].   Has lots of bites on legs .  Hikes often.  Using benadryl oral, topical antiseptic, calamine and topical hydrocortisone to legs.    Having some joint pains this past week.  Joint pains hands, fingers, both knees.   Right middle finger cant fully bend or straighten due to pain and swelling.  Has been using naproxen for this some.  No fever.       Had tick bite July 18th back of right calve.   Never saw bullseye rash.   Had lymphoma back in 2010.     No other aggravating or relieving factors.    No other c/o.  Past Medical History:  Diagnosis Date   History of chicken pox    Large cell, follicular non-Hodgkin's lymphoma ([place]) 12/14/2010   Medical history non-contributory    Spleen enlarged    Current Outpatient Medications on File Prior to Visit  Medication Sig Dispense Refill   cholecalciferol (VITAMIN D) 1000 units tablet Take 1,000 Units by mouth daily.     diphenhydrAMINE HCl (BENADRYL ALLERGY PO) Take by mouth.     Ferrous Sulfate (IRON PO) Take by mouth. Couple times a week     MAGNESIUM MALATE PO Take by mouth.     NAPROXEN PO Take by mouth.     vitamin C (ASCORBIC ACID) 500 MG tablet Take 500 mg by mouth daily.     No current facility-administered medications on file prior to visit.     The following portions of the patient's history were reviewed and updated as appropriate: allergies, current medications, past family history, past medical history, past social history, past surgical history and problem list.  ROS Otherwise as in subjective  above  Objective: BP 120/70   Pulse 64   Temp 99.3 F (37.4 C)   Wt 219 lb (99.3 kg)   BMI 34.30 kg/m   General appearance: alert, no distress, well developed, well nourished Skin: numerous round pink/red 70m round scabs of both lower legs suggestive of recent chigger bites, no other worrisome skin lesions Neck: supple, no lymphadenopathy, no thyromegaly, no masses Heart: RRR, normal S1, S2, no murmurs Lungs: CTA bilaterally, no wheezes, rhonchi, or rales Msk: right middle finger with localized swelling at PIP, can't fully extend or bend right middle finger, no other joint swollen or tendnerss Pulses: 2+ radial pulses, 2+ pedal pulses, normal cap refill Ext: no edema   Assessment: Encounter Diagnoses  Name Primary?   Rash Yes   Tick bite of right lower leg, initial encounter    Chigger bite    Arthralgia, unspecified joint    Joint swelling      Plan: We reviewed her chart record.  We discussed her recent chigger bites, discussed her recent tick bite and findings including skin findings and arthritic changes.   Begin oral doxycycline antibiotic.  Given the chiggers, given the potential for bacterial infection and recent tick bite a round the doxycycline antibiotic seems reasonable.  You can use  over-the-counter Aleve or ibuprofen for joint pain and swelling short-term  Begin triamcinolone steroid cream for the legs to help the rash cleared up quicker.  You can use this throughout both lower legs with all the bite marks.  Do not use this cream on your face or ears.  Do not use it more than 7 to 10 days at a time  You can continue Benadryl by mouth a few more days to help with the reaction to the bites.  If you continue to have joint pain and swelling over the next several months or new or worsening symptoms in this regard then recheck.  The joint pains could be related to the recent tick bite but could also be due to the fact that you use your hands on your job and can have  wear-and-tear arthritic changes over time    Stacey Livingston was seen today for tick bite recently.  Diagnoses and all orders for this visit:  Rash -     CBC with Differential/Platelet -     Lyme Disease Serology w/Reflex  Tick bite of right lower leg, initial encounter -     Lyme Disease Serology w/Reflex  Chigger bite  Arthralgia, unspecified joint  Joint swelling -     Lyme Disease Serology w/Reflex  Other orders -     doxycycline (VIBRA-TABS) 100 MG tablet; Take 1 tablet (100 mg total) by mouth 2 (two) times daily. -     triamcinolone cream (KENALOG) 0.1 %; Apply 1 Application topically 2 (two) times daily.    Follow up: pending labs

## 2021-10-13 NOTE — Patient Instructions (Signed)
  Begin oral doxycycline antibiotic.  Given the chiggers, given the potential for bacterial infection and recent tick bite a round the doxycycline antibiotic seems reasonable.  You can use over-the-counter Aleve or ibuprofen for joint pain and swelling short-term  Begin triamcinolone steroid cream for the legs to help the rash cleared up quicker.  You can use this throughout both lower legs with all the bite marks.  Do not use this cream on your face or ears.  Do not use it more than 7 to 10 days at a time  You can continue Benadryl by mouth a few more days to help with the reaction to the bites.  If you continue to have joint pain and swelling over the next several months or new or worsening symptoms in this regard then recheck.  The joint pains could be related to the recent tick bite but could also be due to the fact that you use your hands on your job and can have wear-and-tear arthritic changes over time

## 2021-10-14 LAB — LYME DISEASE SEROLOGY W/REFLEX: Lyme Total Antibody EIA: NEGATIVE

## 2021-11-03 ENCOUNTER — Encounter: Payer: Self-pay | Admitting: Internal Medicine

## 2021-12-16 ENCOUNTER — Encounter: Payer: Self-pay | Admitting: Gastroenterology

## 2022-01-25 DIAGNOSIS — Z419 Encounter for procedure for purposes other than remedying health state, unspecified: Secondary | ICD-10-CM | POA: Diagnosis not present

## 2022-02-25 DIAGNOSIS — Z419 Encounter for procedure for purposes other than remedying health state, unspecified: Secondary | ICD-10-CM | POA: Diagnosis not present

## 2022-03-26 DIAGNOSIS — Z419 Encounter for procedure for purposes other than remedying health state, unspecified: Secondary | ICD-10-CM | POA: Diagnosis not present

## 2022-04-26 DIAGNOSIS — Z419 Encounter for procedure for purposes other than remedying health state, unspecified: Secondary | ICD-10-CM | POA: Diagnosis not present

## 2022-05-26 DIAGNOSIS — Z419 Encounter for procedure for purposes other than remedying health state, unspecified: Secondary | ICD-10-CM | POA: Diagnosis not present

## 2022-06-26 DIAGNOSIS — Z419 Encounter for procedure for purposes other than remedying health state, unspecified: Secondary | ICD-10-CM | POA: Diagnosis not present

## 2022-07-26 DIAGNOSIS — Z419 Encounter for procedure for purposes other than remedying health state, unspecified: Secondary | ICD-10-CM | POA: Diagnosis not present

## 2022-08-26 DIAGNOSIS — Z419 Encounter for procedure for purposes other than remedying health state, unspecified: Secondary | ICD-10-CM | POA: Diagnosis not present

## 2022-09-26 DIAGNOSIS — Z419 Encounter for procedure for purposes other than remedying health state, unspecified: Secondary | ICD-10-CM | POA: Diagnosis not present

## 2022-10-26 DIAGNOSIS — Z419 Encounter for procedure for purposes other than remedying health state, unspecified: Secondary | ICD-10-CM | POA: Diagnosis not present

## 2022-11-26 DIAGNOSIS — Z419 Encounter for procedure for purposes other than remedying health state, unspecified: Secondary | ICD-10-CM | POA: Diagnosis not present

## 2022-12-26 DIAGNOSIS — Z419 Encounter for procedure for purposes other than remedying health state, unspecified: Secondary | ICD-10-CM | POA: Diagnosis not present

## 2023-01-26 DIAGNOSIS — Z419 Encounter for procedure for purposes other than remedying health state, unspecified: Secondary | ICD-10-CM | POA: Diagnosis not present

## 2023-02-26 DIAGNOSIS — Z419 Encounter for procedure for purposes other than remedying health state, unspecified: Secondary | ICD-10-CM | POA: Diagnosis not present

## 2023-03-26 DIAGNOSIS — Z419 Encounter for procedure for purposes other than remedying health state, unspecified: Secondary | ICD-10-CM | POA: Diagnosis not present

## 2023-05-07 DIAGNOSIS — Z419 Encounter for procedure for purposes other than remedying health state, unspecified: Secondary | ICD-10-CM | POA: Diagnosis not present

## 2023-06-06 DIAGNOSIS — Z419 Encounter for procedure for purposes other than remedying health state, unspecified: Secondary | ICD-10-CM | POA: Diagnosis not present

## 2023-07-07 DIAGNOSIS — Z419 Encounter for procedure for purposes other than remedying health state, unspecified: Secondary | ICD-10-CM | POA: Diagnosis not present

## 2023-08-06 DIAGNOSIS — Z419 Encounter for procedure for purposes other than remedying health state, unspecified: Secondary | ICD-10-CM | POA: Diagnosis not present

## 2023-09-06 DIAGNOSIS — Z419 Encounter for procedure for purposes other than remedying health state, unspecified: Secondary | ICD-10-CM | POA: Diagnosis not present

## 2023-10-07 DIAGNOSIS — Z419 Encounter for procedure for purposes other than remedying health state, unspecified: Secondary | ICD-10-CM | POA: Diagnosis not present
